# Patient Record
Sex: Female | Born: 1970 | Race: White | Hispanic: No | Marital: Married | Smoking: Never smoker
Health system: Southern US, Community
[De-identification: ages and names within clinical notes are randomized; demographics above are authoritative.]

## PROBLEM LIST (undated history)

## (undated) DIAGNOSIS — F329 Major depressive disorder, single episode, unspecified: Secondary | ICD-10-CM

## (undated) DIAGNOSIS — Z801 Family history of malignant neoplasm of trachea, bronchus and lung: Secondary | ICD-10-CM

## (undated) DIAGNOSIS — Z800 Family history of malignant neoplasm of digestive organs: Secondary | ICD-10-CM

## (undated) DIAGNOSIS — H269 Unspecified cataract: Secondary | ICD-10-CM

## (undated) DIAGNOSIS — F419 Anxiety disorder, unspecified: Secondary | ICD-10-CM

## (undated) DIAGNOSIS — F32A Depression, unspecified: Secondary | ICD-10-CM

## (undated) DIAGNOSIS — J302 Other seasonal allergic rhinitis: Secondary | ICD-10-CM

## (undated) DIAGNOSIS — I10 Essential (primary) hypertension: Secondary | ICD-10-CM

## (undated) DIAGNOSIS — E785 Hyperlipidemia, unspecified: Secondary | ICD-10-CM

## (undated) HISTORY — PX: DIAGNOSTIC LAPAROSCOPY: SUR761

## (undated) HISTORY — PX: EXPLORATORY LAPAROTOMY: SUR591

## (undated) HISTORY — DX: Family history of malignant neoplasm of trachea, bronchus and lung: Z80.1

## (undated) HISTORY — DX: Family history of malignant neoplasm of digestive organs: Z80.0

## (undated) HISTORY — DX: Unspecified cataract: H26.9

## (undated) HISTORY — DX: Hyperlipidemia, unspecified: E78.5

## (undated) HISTORY — PX: EYE SURGERY: SHX253

## (undated) HISTORY — PX: CRANIOTOMY: SHX93

## (undated) HISTORY — PX: ADENOIDECTOMY: SUR15

## (undated) HISTORY — PX: CATARACT EXTRACTION, BILATERAL: SHX1313

---

## 2012-01-12 ENCOUNTER — Other Ambulatory Visit: Payer: Self-pay | Admitting: Obstetrics & Gynecology

## 2012-01-12 DIAGNOSIS — R928 Other abnormal and inconclusive findings on diagnostic imaging of breast: Secondary | ICD-10-CM

## 2012-01-16 ENCOUNTER — Ambulatory Visit
Admission: RE | Admit: 2012-01-16 | Discharge: 2012-01-16 | Disposition: A | Discharge status: Home / Self Care | Payer: BC Managed Care – PPO | Source: Ambulatory Visit | Attending: Obstetrics & Gynecology | Admitting: Obstetrics & Gynecology

## 2012-01-16 DIAGNOSIS — R928 Other abnormal and inconclusive findings on diagnostic imaging of breast: Secondary | ICD-10-CM

## 2012-12-10 ENCOUNTER — Ambulatory Visit (INDEPENDENT_AMBULATORY_CARE_PROVIDER_SITE_OTHER): Payer: BC Managed Care – PPO | Admitting: Licensed Clinical Social Worker

## 2012-12-10 DIAGNOSIS — F331 Major depressive disorder, recurrent, moderate: Secondary | ICD-10-CM

## 2012-12-27 ENCOUNTER — Ambulatory Visit (INDEPENDENT_AMBULATORY_CARE_PROVIDER_SITE_OTHER): Payer: BC Managed Care – PPO | Admitting: Licensed Clinical Social Worker

## 2012-12-27 DIAGNOSIS — F331 Major depressive disorder, recurrent, moderate: Secondary | ICD-10-CM

## 2012-12-30 ENCOUNTER — Ambulatory Visit (INDEPENDENT_AMBULATORY_CARE_PROVIDER_SITE_OTHER): Payer: BC Managed Care – PPO | Admitting: Licensed Clinical Social Worker

## 2012-12-30 DIAGNOSIS — F331 Major depressive disorder, recurrent, moderate: Secondary | ICD-10-CM

## 2013-01-02 ENCOUNTER — Other Ambulatory Visit: Payer: Self-pay

## 2013-01-02 DIAGNOSIS — Z1231 Encounter for screening mammogram for malignant neoplasm of breast: Secondary | ICD-10-CM

## 2013-01-10 ENCOUNTER — Ambulatory Visit (INDEPENDENT_AMBULATORY_CARE_PROVIDER_SITE_OTHER): Payer: BC Managed Care – PPO | Admitting: Licensed Clinical Social Worker

## 2013-01-10 DIAGNOSIS — F331 Major depressive disorder, recurrent, moderate: Secondary | ICD-10-CM

## 2013-01-22 ENCOUNTER — Ambulatory Visit (INDEPENDENT_AMBULATORY_CARE_PROVIDER_SITE_OTHER): Payer: BC Managed Care – PPO | Admitting: Licensed Clinical Social Worker

## 2013-01-22 DIAGNOSIS — F331 Major depressive disorder, recurrent, moderate: Secondary | ICD-10-CM

## 2013-01-24 ENCOUNTER — Ambulatory Visit
Admission: RE | Admit: 2013-01-24 | Discharge: 2013-01-24 | Disposition: A | Discharge status: Home / Self Care | Payer: BC Managed Care – PPO | Source: Ambulatory Visit

## 2013-01-24 DIAGNOSIS — Z1231 Encounter for screening mammogram for malignant neoplasm of breast: Secondary | ICD-10-CM

## 2013-02-05 ENCOUNTER — Ambulatory Visit (INDEPENDENT_AMBULATORY_CARE_PROVIDER_SITE_OTHER): Payer: BC Managed Care – PPO | Admitting: Licensed Clinical Social Worker

## 2013-02-05 DIAGNOSIS — F331 Major depressive disorder, recurrent, moderate: Secondary | ICD-10-CM

## 2013-02-19 ENCOUNTER — Ambulatory Visit (INDEPENDENT_AMBULATORY_CARE_PROVIDER_SITE_OTHER): Payer: BC Managed Care – PPO | Admitting: Licensed Clinical Social Worker

## 2013-02-19 DIAGNOSIS — F331 Major depressive disorder, recurrent, moderate: Secondary | ICD-10-CM

## 2013-03-17 ENCOUNTER — Ambulatory Visit (INDEPENDENT_AMBULATORY_CARE_PROVIDER_SITE_OTHER): Payer: BC Managed Care – PPO | Admitting: Licensed Clinical Social Worker

## 2013-03-17 DIAGNOSIS — F331 Major depressive disorder, recurrent, moderate: Secondary | ICD-10-CM

## 2013-04-02 ENCOUNTER — Ambulatory Visit (INDEPENDENT_AMBULATORY_CARE_PROVIDER_SITE_OTHER): Payer: BC Managed Care – PPO | Admitting: Licensed Clinical Social Worker

## 2013-04-02 DIAGNOSIS — F331 Major depressive disorder, recurrent, moderate: Secondary | ICD-10-CM

## 2013-04-28 ENCOUNTER — Ambulatory Visit (INDEPENDENT_AMBULATORY_CARE_PROVIDER_SITE_OTHER): Payer: BC Managed Care – PPO | Admitting: Licensed Clinical Social Worker

## 2013-04-28 DIAGNOSIS — F331 Major depressive disorder, recurrent, moderate: Secondary | ICD-10-CM

## 2015-02-05 ENCOUNTER — Other Ambulatory Visit: Payer: Self-pay | Admitting: Obstetrics & Gynecology

## 2015-02-05 DIAGNOSIS — R928 Other abnormal and inconclusive findings on diagnostic imaging of breast: Secondary | ICD-10-CM

## 2015-02-12 ENCOUNTER — Other Ambulatory Visit: Payer: Self-pay

## 2015-02-15 ENCOUNTER — Ambulatory Visit
Admission: RE | Admit: 2015-02-15 | Discharge: 2015-02-15 | Disposition: A | Discharge status: Home / Self Care | Payer: BLUE CROSS/BLUE SHIELD | Source: Ambulatory Visit | Attending: Obstetrics & Gynecology | Admitting: Obstetrics & Gynecology

## 2015-02-15 ENCOUNTER — Other Ambulatory Visit: Payer: Self-pay | Admitting: Obstetrics & Gynecology

## 2015-02-15 DIAGNOSIS — R928 Other abnormal and inconclusive findings on diagnostic imaging of breast: Secondary | ICD-10-CM

## 2015-02-15 DIAGNOSIS — R921 Mammographic calcification found on diagnostic imaging of breast: Secondary | ICD-10-CM

## 2015-02-17 ENCOUNTER — Other Ambulatory Visit: Payer: Self-pay | Admitting: Obstetrics & Gynecology

## 2015-02-17 DIAGNOSIS — R921 Mammographic calcification found on diagnostic imaging of breast: Secondary | ICD-10-CM

## 2015-02-18 ENCOUNTER — Ambulatory Visit
Admission: RE | Admit: 2015-02-18 | Discharge: 2015-02-18 | Disposition: A | Discharge status: Home / Self Care | Payer: BLUE CROSS/BLUE SHIELD | Source: Ambulatory Visit | Attending: Obstetrics & Gynecology | Admitting: Obstetrics & Gynecology

## 2015-02-18 DIAGNOSIS — R921 Mammographic calcification found on diagnostic imaging of breast: Secondary | ICD-10-CM

## 2016-02-07 ENCOUNTER — Other Ambulatory Visit: Payer: Self-pay | Admitting: Obstetrics & Gynecology

## 2016-02-07 DIAGNOSIS — R928 Other abnormal and inconclusive findings on diagnostic imaging of breast: Secondary | ICD-10-CM

## 2016-02-11 ENCOUNTER — Ambulatory Visit
Admission: RE | Admit: 2016-02-11 | Discharge: 2016-02-11 | Disposition: A | Discharge status: Home / Self Care | Payer: No Typology Code available for payment source | Source: Ambulatory Visit | Attending: Obstetrics & Gynecology | Admitting: Obstetrics & Gynecology

## 2016-02-11 ENCOUNTER — Other Ambulatory Visit: Payer: Self-pay | Admitting: Obstetrics & Gynecology

## 2016-02-11 DIAGNOSIS — N6489 Other specified disorders of breast: Secondary | ICD-10-CM

## 2016-02-11 DIAGNOSIS — R928 Other abnormal and inconclusive findings on diagnostic imaging of breast: Secondary | ICD-10-CM

## 2016-02-17 ENCOUNTER — Other Ambulatory Visit: Payer: Self-pay | Admitting: Obstetrics & Gynecology

## 2016-02-17 DIAGNOSIS — N6489 Other specified disorders of breast: Secondary | ICD-10-CM

## 2016-02-18 ENCOUNTER — Ambulatory Visit
Admission: RE | Admit: 2016-02-18 | Discharge: 2016-02-18 | Disposition: A | Discharge status: Home / Self Care | Payer: No Typology Code available for payment source | Source: Ambulatory Visit | Attending: Obstetrics & Gynecology | Admitting: Obstetrics & Gynecology

## 2016-02-18 DIAGNOSIS — N6489 Other specified disorders of breast: Secondary | ICD-10-CM

## 2016-03-02 ENCOUNTER — Encounter (HOSPITAL_BASED_OUTPATIENT_CLINIC_OR_DEPARTMENT_OTHER): Payer: Self-pay | Admitting: *Deleted

## 2016-03-02 ENCOUNTER — Other Ambulatory Visit: Payer: Self-pay | Admitting: General Surgery

## 2016-03-02 ENCOUNTER — Encounter (HOSPITAL_BASED_OUTPATIENT_CLINIC_OR_DEPARTMENT_OTHER)
Admission: RE | Admit: 2016-03-02 | Discharge: 2016-03-02 | Disposition: A | Discharge status: Home / Self Care | Payer: No Typology Code available for payment source | Source: Ambulatory Visit | Attending: General Surgery | Admitting: General Surgery

## 2016-03-02 ENCOUNTER — Other Ambulatory Visit: Payer: Self-pay

## 2016-03-02 DIAGNOSIS — N6489 Other specified disorders of breast: Secondary | ICD-10-CM

## 2016-03-02 DIAGNOSIS — Z01810 Encounter for preprocedural cardiovascular examination: Secondary | ICD-10-CM | POA: Insufficient documentation

## 2016-03-02 DIAGNOSIS — I10 Essential (primary) hypertension: Secondary | ICD-10-CM | POA: Insufficient documentation

## 2016-03-02 NOTE — Progress Notes (Signed)
Pt in for PAT appt. EKG done and Boost Breeze given. Instructions reviewed for DOS.

## 2016-03-06 ENCOUNTER — Ambulatory Visit
Admission: RE | Admit: 2016-03-06 | Discharge: 2016-03-06 | Disposition: A | Discharge status: Home / Self Care | Payer: No Typology Code available for payment source | Source: Ambulatory Visit | Attending: General Surgery | Admitting: General Surgery

## 2016-03-06 DIAGNOSIS — N6489 Other specified disorders of breast: Secondary | ICD-10-CM

## 2016-03-08 ENCOUNTER — Ambulatory Visit (HOSPITAL_BASED_OUTPATIENT_CLINIC_OR_DEPARTMENT_OTHER): Payer: Self-pay | Admitting: Anesthesiology

## 2016-03-08 ENCOUNTER — Ambulatory Visit
Admission: RE | Admit: 2016-03-08 | Discharge: 2016-03-08 | Disposition: A | Discharge status: Home / Self Care | Payer: No Typology Code available for payment source | Source: Ambulatory Visit | Attending: General Surgery | Admitting: General Surgery

## 2016-03-08 ENCOUNTER — Encounter (HOSPITAL_BASED_OUTPATIENT_CLINIC_OR_DEPARTMENT_OTHER)
Admission: RE | Disposition: A | Discharge status: Home / Self Care | Payer: Self-pay | Source: Ambulatory Visit | Attending: General Surgery

## 2016-03-08 ENCOUNTER — Ambulatory Visit (HOSPITAL_BASED_OUTPATIENT_CLINIC_OR_DEPARTMENT_OTHER)
Admission: RE | Admit: 2016-03-08 | Discharge: 2016-03-08 | Disposition: A | Discharge status: Home / Self Care | Payer: Self-pay | Source: Ambulatory Visit | Attending: General Surgery | Admitting: General Surgery

## 2016-03-08 ENCOUNTER — Encounter (HOSPITAL_BASED_OUTPATIENT_CLINIC_OR_DEPARTMENT_OTHER): Payer: Self-pay | Admitting: Anesthesiology

## 2016-03-08 DIAGNOSIS — Z8249 Family history of ischemic heart disease and other diseases of the circulatory system: Secondary | ICD-10-CM | POA: Insufficient documentation

## 2016-03-08 DIAGNOSIS — F329 Major depressive disorder, single episode, unspecified: Secondary | ICD-10-CM | POA: Insufficient documentation

## 2016-03-08 DIAGNOSIS — N6489 Other specified disorders of breast: Secondary | ICD-10-CM

## 2016-03-08 DIAGNOSIS — I10 Essential (primary) hypertension: Secondary | ICD-10-CM | POA: Insufficient documentation

## 2016-03-08 DIAGNOSIS — F419 Anxiety disorder, unspecified: Secondary | ICD-10-CM | POA: Insufficient documentation

## 2016-03-08 DIAGNOSIS — Z833 Family history of diabetes mellitus: Secondary | ICD-10-CM | POA: Insufficient documentation

## 2016-03-08 DIAGNOSIS — Z9889 Other specified postprocedural states: Secondary | ICD-10-CM | POA: Insufficient documentation

## 2016-03-08 DIAGNOSIS — Z801 Family history of malignant neoplasm of trachea, bronchus and lung: Secondary | ICD-10-CM | POA: Insufficient documentation

## 2016-03-08 DIAGNOSIS — N6091 Unspecified benign mammary dysplasia of right breast: Secondary | ICD-10-CM | POA: Insufficient documentation

## 2016-03-08 DIAGNOSIS — Z79899 Other long term (current) drug therapy: Secondary | ICD-10-CM | POA: Insufficient documentation

## 2016-03-08 HISTORY — DX: Other seasonal allergic rhinitis: J30.2

## 2016-03-08 HISTORY — PX: RADIOACTIVE SEED GUIDED EXCISIONAL BREAST BIOPSY: SHX6490

## 2016-03-08 HISTORY — DX: Essential (primary) hypertension: I10

## 2016-03-08 HISTORY — DX: Major depressive disorder, single episode, unspecified: F32.9

## 2016-03-08 HISTORY — DX: Depression, unspecified: F32.A

## 2016-03-08 HISTORY — DX: Anxiety disorder, unspecified: F41.9

## 2016-03-08 SURGERY — RADIOACTIVE SEED GUIDED BREAST BIOPSY
Anesthesia: General | Site: Breast | Laterality: Right

## 2016-03-08 MED ORDER — SCOPOLAMINE 1 MG/3DAYS TD PT72
MEDICATED_PATCH | TRANSDERMAL | Status: AC
  Filled 2016-03-08: qty 1

## 2016-03-08 MED ORDER — DEXAMETHASONE SODIUM PHOSPHATE 4 MG/ML IJ SOLN
INTRAMUSCULAR | Status: DC | PRN
  Administered 2016-03-08: 10 mg via INTRAVENOUS

## 2016-03-08 MED ORDER — PROPOFOL 10 MG/ML IV BOLUS
INTRAVENOUS | Status: AC
  Filled 2016-03-08: qty 20

## 2016-03-08 MED ORDER — METHYLENE BLUE 0.5 % INJ SOLN
INTRAVENOUS | Status: AC
  Filled 2016-03-08: qty 10

## 2016-03-08 MED ORDER — LACTATED RINGERS IV SOLN
INTRAVENOUS | Status: DC
  Administered 2016-03-08 (×2): via INTRAVENOUS

## 2016-03-08 MED ORDER — ACETAMINOPHEN 500 MG PO TABS
1000.0000 mg | ORAL_TABLET | ORAL | Status: AC
  Administered 2016-03-08: 1000 mg via ORAL

## 2016-03-08 MED ORDER — MIDAZOLAM HCL 2 MG/2ML IJ SOLN
INTRAMUSCULAR | Status: AC
  Filled 2016-03-08: qty 2

## 2016-03-08 MED ORDER — HYDROMORPHONE HCL 1 MG/ML IJ SOLN
0.2500 mg | INTRAMUSCULAR | Status: DC | PRN
  Administered 2016-03-08 (×3): 0.5 mg via INTRAVENOUS

## 2016-03-08 MED ORDER — PROPOFOL 10 MG/ML IV BOLUS
INTRAVENOUS | Status: DC | PRN
  Administered 2016-03-08: 150 mg via INTRAVENOUS
  Administered 2016-03-08: 50 mg via INTRAVENOUS

## 2016-03-08 MED ORDER — FENTANYL CITRATE (PF) 100 MCG/2ML IJ SOLN
INTRAMUSCULAR | Status: AC
  Filled 2016-03-08: qty 2

## 2016-03-08 MED ORDER — ONDANSETRON HCL 4 MG/2ML IJ SOLN
INTRAMUSCULAR | Status: DC | PRN
  Administered 2016-03-08: 4 mg via INTRAVENOUS

## 2016-03-08 MED ORDER — GABAPENTIN 300 MG PO CAPS
300.0000 mg | ORAL_CAPSULE | ORAL | Status: AC
  Administered 2016-03-08: 300 mg via ORAL

## 2016-03-08 MED ORDER — MIDAZOLAM HCL 5 MG/5ML IJ SOLN
INTRAMUSCULAR | Status: DC | PRN
  Administered 2016-03-08: 2 mg via INTRAVENOUS

## 2016-03-08 MED ORDER — FENTANYL CITRATE (PF) 100 MCG/2ML IJ SOLN: 50.0000 ug | INTRAMUSCULAR | Status: DC | PRN

## 2016-03-08 MED ORDER — OXYCODONE-ACETAMINOPHEN 10-325 MG PO TABS: 1.0000 | ORAL_TABLET | Freq: Four times a day (QID) | ORAL | 0 refills | Status: AC | PRN

## 2016-03-08 MED ORDER — HEPARIN (PORCINE) IN NACL 2-0.9 UNIT/ML-% IJ SOLN
INTRAMUSCULAR | Status: AC
  Filled 2016-03-08: qty 1000

## 2016-03-08 MED ORDER — CEFAZOLIN SODIUM-DEXTROSE 2-4 GM/100ML-% IV SOLN
2.0000 g | INTRAVENOUS | Status: AC
  Administered 2016-03-08: 2 g via INTRAVENOUS

## 2016-03-08 MED ORDER — FENTANYL CITRATE (PF) 100 MCG/2ML IJ SOLN
INTRAMUSCULAR | Status: DC | PRN
  Administered 2016-03-08: 100 ug via INTRAVENOUS

## 2016-03-08 MED ORDER — MIDAZOLAM HCL 2 MG/2ML IJ SOLN: 1.0000 mg | INTRAMUSCULAR | Status: DC | PRN

## 2016-03-08 MED ORDER — DEXAMETHASONE SODIUM PHOSPHATE 10 MG/ML IJ SOLN
INTRAMUSCULAR | Status: AC
  Filled 2016-03-08: qty 1

## 2016-03-08 MED ORDER — GLYCOPYRROLATE 0.2 MG/ML IJ SOLN: 0.2000 mg | Freq: Once | INTRAMUSCULAR | Status: DC | PRN

## 2016-03-08 MED ORDER — ONDANSETRON HCL 4 MG/2ML IJ SOLN
INTRAMUSCULAR | Status: AC
  Filled 2016-03-08: qty 2

## 2016-03-08 MED ORDER — CEFAZOLIN SODIUM-DEXTROSE 2-4 GM/100ML-% IV SOLN
INTRAVENOUS | Status: AC
  Filled 2016-03-08: qty 100

## 2016-03-08 MED ORDER — SODIUM CHLORIDE 0.9 % IJ SOLN
INTRAMUSCULAR | Status: AC
  Filled 2016-03-08: qty 10

## 2016-03-08 MED ORDER — HEPARIN SOD (PORK) LOCK FLUSH 100 UNIT/ML IV SOLN
INTRAVENOUS | Status: AC
  Filled 2016-03-08: qty 10

## 2016-03-08 MED ORDER — HYDROMORPHONE HCL 1 MG/ML IJ SOLN
INTRAMUSCULAR | Status: AC
Start: 2016-03-08 — End: 2016-03-08
  Filled 2016-03-08: qty 1

## 2016-03-08 MED ORDER — GABAPENTIN 300 MG PO CAPS
ORAL_CAPSULE | ORAL | Status: AC
  Filled 2016-03-08: qty 1

## 2016-03-08 MED ORDER — PROMETHAZINE HCL 25 MG/ML IJ SOLN: 6.2500 mg | INTRAMUSCULAR | Status: DC | PRN

## 2016-03-08 MED ORDER — SCOPOLAMINE 1 MG/3DAYS TD PT72
1.0000 | MEDICATED_PATCH | Freq: Once | TRANSDERMAL | Status: DC | PRN
  Administered 2016-03-08: 1.5 mg via TRANSDERMAL

## 2016-03-08 MED ORDER — BUPIVACAINE HCL (PF) 0.25 % IJ SOLN
INTRAMUSCULAR | Status: DC | PRN
  Administered 2016-03-08: 10 mL

## 2016-03-08 MED ORDER — LIDOCAINE HCL (CARDIAC) 20 MG/ML IV SOLN
INTRAVENOUS | Status: DC | PRN
  Administered 2016-03-08: 30 mg via INTRAVENOUS

## 2016-03-08 MED ORDER — HYDROMORPHONE HCL 1 MG/ML IJ SOLN
INTRAMUSCULAR | Status: AC
  Filled 2016-03-08: qty 1

## 2016-03-08 MED ORDER — LIDOCAINE 2% (20 MG/ML) 5 ML SYRINGE
INTRAMUSCULAR | Status: AC
  Filled 2016-03-08: qty 5

## 2016-03-08 MED ORDER — ACETAMINOPHEN 500 MG PO TABS
ORAL_TABLET | ORAL | Status: AC
  Filled 2016-03-08: qty 2

## 2016-03-08 MED ORDER — BUPIVACAINE HCL (PF) 0.25 % IJ SOLN
INTRAMUSCULAR | Status: AC
  Filled 2016-03-08: qty 150

## 2016-03-08 SURGICAL SUPPLY — 61 items
ADH SKN CLS APL DERMABOND .7 (GAUZE/BANDAGES/DRESSINGS) ×1
BINDER BREAST LRG (GAUZE/BANDAGES/DRESSINGS) IMPLANT
BINDER BREAST MEDIUM (GAUZE/BANDAGES/DRESSINGS) ×2 IMPLANT
BINDER BREAST XLRG (GAUZE/BANDAGES/DRESSINGS) IMPLANT
BINDER BREAST XXLRG (GAUZE/BANDAGES/DRESSINGS) IMPLANT
BLADE SURG 15 STRL LF DISP TIS (BLADE) ×1 IMPLANT
BLADE SURG 15 STRL SS (BLADE) ×3
CANISTER SUC SOCK COL 7IN (MISCELLANEOUS) IMPLANT
CANISTER SUCT 1200ML W/VALVE (MISCELLANEOUS) IMPLANT
CHLORAPREP W/TINT 26ML (MISCELLANEOUS) ×3 IMPLANT
CLIP TI WIDE RED SMALL 6 (CLIP) IMPLANT
CLOSURE WOUND 1/2 X4 (GAUZE/BANDAGES/DRESSINGS) ×1
COVER BACK TABLE 60X90IN (DRAPES) ×3 IMPLANT
COVER MAYO STAND STRL (DRAPES) ×3 IMPLANT
COVER PROBE W GEL 5X96 (DRAPES) ×3 IMPLANT
DECANTER SPIKE VIAL GLASS SM (MISCELLANEOUS) IMPLANT
DERMABOND ADVANCED (GAUZE/BANDAGES/DRESSINGS) ×2
DERMABOND ADVANCED .7 DNX12 (GAUZE/BANDAGES/DRESSINGS) ×1 IMPLANT
DEVICE DUBIN W/COMP PLATE 8390 (MISCELLANEOUS) ×3 IMPLANT
DRAPE LAPAROSCOPIC ABDOMINAL (DRAPES) ×3 IMPLANT
DRAPE UTILITY XL STRL (DRAPES) ×3 IMPLANT
DRSG TEGADERM 4X4.75 (GAUZE/BANDAGES/DRESSINGS) IMPLANT
ELECT COATED BLADE 2.86 ST (ELECTRODE) ×3 IMPLANT
ELECT REM PT RETURN 9FT ADLT (ELECTROSURGICAL) ×3
ELECTRODE REM PT RTRN 9FT ADLT (ELECTROSURGICAL) ×1 IMPLANT
GLOVE BIO SURGEON STRL SZ7 (GLOVE) ×6 IMPLANT
GLOVE BIOGEL PI IND STRL 6.5 (GLOVE) IMPLANT
GLOVE BIOGEL PI IND STRL 7.0 (GLOVE) IMPLANT
GLOVE BIOGEL PI IND STRL 7.5 (GLOVE) ×1 IMPLANT
GLOVE BIOGEL PI INDICATOR 6.5 (GLOVE) ×2
GLOVE BIOGEL PI INDICATOR 7.0 (GLOVE) ×4
GLOVE BIOGEL PI INDICATOR 7.5 (GLOVE) ×2
GOWN STRL REUS W/ TWL LRG LVL3 (GOWN DISPOSABLE) ×2 IMPLANT
GOWN STRL REUS W/TWL LRG LVL3 (GOWN DISPOSABLE) ×6
ILLUMINATOR WAVEGUIDE N/F (MISCELLANEOUS) IMPLANT
KIT MARKER MARGIN INK (KITS) ×3 IMPLANT
LIGHT WAVEGUIDE WIDE FLAT (MISCELLANEOUS) IMPLANT
NDL HYPO 25X1 1.5 SAFETY (NEEDLE) ×1 IMPLANT
NEEDLE HYPO 25X1 1.5 SAFETY (NEEDLE) ×3 IMPLANT
NS IRRIG 1000ML POUR BTL (IV SOLUTION) IMPLANT
PACK BASIN DAY SURGERY FS (CUSTOM PROCEDURE TRAY) ×3 IMPLANT
PENCIL BUTTON HOLSTER BLD 10FT (ELECTRODE) ×3 IMPLANT
SHEET MEDIUM DRAPE 40X70 STRL (DRAPES) IMPLANT
SLEEVE SCD COMPRESS KNEE MED (MISCELLANEOUS) ×3 IMPLANT
SPONGE GAUZE 4X4 12PLY STER LF (GAUZE/BANDAGES/DRESSINGS) IMPLANT
SPONGE LAP 4X18 X RAY DECT (DISPOSABLE) ×3 IMPLANT
STRIP CLOSURE SKIN 1/2X4 (GAUZE/BANDAGES/DRESSINGS) ×2 IMPLANT
SUT MNCRL AB 4-0 PS2 18 (SUTURE) IMPLANT
SUT MON AB 5-0 PS2 18 (SUTURE) ×2 IMPLANT
SUT SILK 2 0 SH (SUTURE) IMPLANT
SUT VIC AB 2-0 SH 27 (SUTURE) ×3
SUT VIC AB 2-0 SH 27XBRD (SUTURE) ×1 IMPLANT
SUT VIC AB 3-0 SH 27 (SUTURE) ×3
SUT VIC AB 3-0 SH 27X BRD (SUTURE) ×1 IMPLANT
SUT VIC AB 5-0 PS2 18 (SUTURE) IMPLANT
SYR CONTROL 10ML LL (SYRINGE) ×3 IMPLANT
TOWEL OR 17X24 6PK STRL BLUE (TOWEL DISPOSABLE) ×3 IMPLANT
TOWEL OR NON WOVEN STRL DISP B (DISPOSABLE) ×3 IMPLANT
TUBE CONNECTING 20'X1/4 (TUBING)
TUBE CONNECTING 20X1/4 (TUBING) IMPLANT
YANKAUER SUCT BULB TIP NO VENT (SUCTIONS) IMPLANT

## 2016-03-08 NOTE — H&P (Signed)
45 yof referred by Dr Linda Hedges for right breast mass on 3D mammogram. she has prior history of left breast calcs that were biopsied negative. her density is D. there is area of architectural distortion in the central and inferior right breast measuring 2.6 cm. US shows no abnl tissue and axilla negative. she has family history of lung cancer (nonsmoker) in her mother who is deceased age 47. she underwent core biopsy and this is a CSL. she has no mass or discharge on exam.   Other Problems Marjean Donna, [place]; 03/02/2016 9:58 AM) Anxiety Disorder Depression High blood pressure  Past Surgical History Marjean Donna, [place]; 03/02/2016 9:58 AM) Breast Biopsy Bilateral. Oral Surgery  Diagnostic Studies History Marjean Donna, CMA; 03/02/2016 9:58 AM) Colonoscopy never Mammogram within last year Pap Smear 1-5 years ago  Allergies Davy Pique Bynum, CMA; 03/02/2016 9:59 AM) No Known Drug Allergies10/26/2017  Medication History (Sonya Bynum, CMA; 03/02/2016 9:59 AM) Zoloft (25MG  Tablet, Oral) Active. Calcium "900" w/D (Oral) Active. Fish Oil + D3 (1000-1000MG -UNIT Capsule, Oral) Active. Valsartan (80MG  Tablet, Oral) Active. Medications Reconciled  Social History ([place]; 03/02/2016 9:58 AM) Alcohol use Occasional alcohol use. Caffeine use Carbonated beverages, Tea. No drug use Tobacco use Never smoker.  Family History Marjean Donna, [place]; 03/02/2016 9:58 AM) Cancer Mother. Diabetes Mellitus Father. Hypertension Father.  Pregnancy / Birth History Marjean Donna, [place]; 03/02/2016 9:58 AM) Age at menarche 35 years. Contraceptive History Oral contraceptives. Gravida 1 Maternal age 47-35 Para 1 Regular periods  Review of Systems ([place]; 03/02/2016 9:58 AM) General Present- Fatigue. Not Present- Appetite Loss, Chills, Fever, Night Sweats, Weight Gain and Weight Loss. Skin Not Present- Change in Wart/Mole, Dryness, Hives, Jaundice, New  Lesions, Non-Healing Wounds, Rash and Ulcer. HEENT Present- Wears glasses/contact lenses. Not Present- Earache, Hearing Loss, Hoarseness, Nose Bleed, Oral Ulcers, Ringing in the Ears, Seasonal Allergies, Sinus Pain, Sore Throat, Visual Disturbances and Yellow Eyes. Respiratory Not Present- Bloody sputum, Chronic Cough, Difficulty Breathing, Snoring and Wheezing. Breast Present- Breast Mass. Not Present- Breast Pain, Nipple Discharge and Skin Changes. Cardiovascular Not Present- Chest Pain, Difficulty Breathing Lying Down, Leg Cramps, Palpitations, Rapid Heart Rate, Shortness of Breath and Swelling of Extremities. Gastrointestinal Not Present- Abdominal Pain, Bloating, Bloody Stool, Change in Bowel Habits, Chronic diarrhea, Constipation, Difficulty Swallowing, Excessive gas, Gets full quickly at meals, Hemorrhoids, Indigestion, Nausea, Rectal Pain and Vomiting. Female Genitourinary Not Present- Frequency, Nocturia, Painful Urination, Pelvic Pain and Urgency. Musculoskeletal Not Present- Back Pain, Joint Pain, Joint Stiffness, Muscle Pain, Muscle Weakness and Swelling of Extremities. Neurological Not Present- Decreased Memory, Fainting, Headaches, Numbness, Seizures, Tingling, Tremor, Trouble walking and Weakness. Psychiatric Present- Anxiety, Depression, Fearful and Frequent crying. Not Present- Bipolar and Change in Sleep Pattern. Endocrine Not Present- Cold Intolerance, Excessive Hunger, Hair Changes, Heat Intolerance, Hot flashes and New Diabetes. Hematology Not Present- Blood Thinners, Easy Bruising, Excessive bleeding, Gland problems, HIV and Persistent Infections.  Vitals (Sonya Bynum CMA; 03/02/2016 9:59 AM) 03/02/2016 9:58 AM Weight: 133 lb Height: 64in Body Surface Area: 1.64 m Body Mass Index: 22.83 kg/m  Temp.: 21F(Temporal)  Pulse: 76 (Regular)  BP: 126/80 (Sitting, Left Arm, Standard)   Physical Exam Rolm Bookbinder MD; 03/02/2016 10:21 AM) General Mental  Status-Alert. Orientation-Oriented X3.  Chest and Lung Exam Chest and lung exam reveals -on auscultation, normal breath sounds, no adventitious sounds and normal vocal resonance.  Breast Nipples-No Discharge. Breast Lump-No Palpable Breast Mass.  Cardiovascular Cardiovascular examination reveals -normal heart sounds, regular rate and rhythm with no murmurs.  Lymphatic Head & Neck  General Head & Neck Lymphatics: Bilateral - Description - Normal. Axillary  General Axillary Region: Bilateral - Description - Normal. Note: no [place] adenopathy   Assessment & Plan Rolm Bookbinder MD; 03/02/2016 10:24 AM) RADIAL SCAR OF BREAST [id number]) Story: Right breast seed guided excisional biopsy discussed indication for excision being possibility of upgrading this lesion. we discussed seed guided excision, recovery, and risks.

## 2016-03-08 NOTE — Anesthesia Postprocedure Evaluation (Signed)
Anesthesia Post Note  Patient: Sierra Newman  Procedure(s) Performed: Procedure(s) (LRB): RADIOACTIVE SEED GUIDED EXCISIONAL BREAST BIOPSY (Right)  Patient location during evaluation: PACU Anesthesia Type: General Level of consciousness: awake and alert Pain management: pain level controlled Vital Signs Assessment: post-procedure vital signs reviewed and stable Respiratory status: spontaneous breathing, nonlabored ventilation, respiratory function stable and patient connected to nasal cannula oxygen Cardiovascular status: blood pressure returned to baseline and stable Postop Assessment: no signs of nausea or vomiting Anesthetic complications: no    Last Vitals:  Vitals:   03/08/16 1200 03/08/16 1253  BP: 108/61 117/60  Pulse: 63 68  Resp: (!) 9 16  Temp:  36.6 C    Last Pain:  Vitals:   03/08/16 1253  TempSrc: Oral  PainSc: 2                  Melisssa Donner,JAMES TERRILL

## 2016-03-08 NOTE — Anesthesia Procedure Notes (Signed)
Procedure Name: LMA Insertion Date/Time: 03/08/2016 10:13 AM Performed by: Toula Moos L Pre-anesthesia Checklist: Patient identified, Emergency Drugs available, Suction available, Patient being monitored and Timeout performed Patient Re-evaluated:Patient Re-evaluated prior to inductionOxygen Delivery Method: Circle system utilized Preoxygenation: Pre-oxygenation with 100% oxygen Intubation Type: IV induction Ventilation: Mask ventilation without difficulty LMA: LMA inserted LMA Size: 4.0 Number of attempts: 1 Airway Equipment and Method: Bite block Placement Confirmation: positive ETCO2 Tube secured with: Tape Dental Injury: Teeth and Oropharynx as per pre-operative assessment

## 2016-03-08 NOTE — Interval H&P Note (Signed)
History and Physical Interval Note:  03/08/2016 9:56 AM  Sierra Newman  has presented today for surgery, with the diagnosis of RIGHT BREAST MASS  The various methods of treatment have been discussed with the patient and family. After consideration of risks, benefits and other options for treatment, the patient has consented to  Procedure(s): RADIOACTIVE SEED GUIDED EXCISIONAL BREAST BIOPSY (Right) as a surgical intervention .  The patient's history has been reviewed, patient examined, no change in status, stable for surgery.  I have reviewed the patient's chart and labs.  Questions were answered to the patient's satisfaction.     [name]

## 2016-03-08 NOTE — Discharge Instructions (Signed)
[facility] °Office Phone Number [phone] ° °POST OP INSTRUCTIONS ° °Always review your discharge instruction sheet given to you by the facility where your surgery was performed. ° °IF YOU HAVE DISABILITY OR FAMILY LEAVE FORMS, YOU MUST BRING THEM TO THE OFFICE FOR PROCESSING.  DO NOT GIVE THEM TO YOUR DOCTOR. ° °1. A prescription for pain medication may be given to you upon discharge.  Take your pain medication as prescribed, if needed.  If narcotic pain medicine is not needed, then you may take acetaminophen (Tylenol), naprosyn (Alleve) or ibuprofen (Advil) as needed. °2. Take your usually prescribed medications unless otherwise directed °3. If you need a refill on your pain medication, please contact your pharmacy.  They will contact our office to request authorization.  Prescriptions will not be filled after 5pm or on week-ends. °4. You should eat very light the first 24 hours after surgery, such as soup, crackers, pudding, etc.  Resume your normal diet the day after surgery. °5. Most patients will experience some swelling and bruising in the breast.  Ice packs and a good support bra will help.  Wear the breast binder provided or a sports bra for 72 hours day and night.  After that wear a sports bra during the day until you return to the office. Swelling and bruising can take several days to resolve.  °6. It is common to experience some constipation if taking pain medication after surgery.  Increasing fluid intake and taking a stool softener will usually help or prevent this problem from occurring.  A mild laxative (Milk of Magnesia or Miralax) should be taken according to package directions if there are no bowel movements after 48 hours. °7. Unless discharge instructions indicate otherwise, you may remove your bandages 48 hours after surgery and you may shower at that time.  You may have steri-strips (small skin tapes) in place directly over the incision.  These strips should be left on the  skin for 7-10 days and will come off on their own.  If your surgeon used skin glue on the incision, you may shower in 24 hours.  The glue will flake off over the next 2-3 weeks.  Any sutures or staples will be removed at the office during your follow-up visit. °8. ACTIVITIES:  You may resume regular daily activities (gradually increasing) beginning the next day.  Wearing a good support bra or sports bra minimizes pain and swelling.  You may have sexual intercourse when it is comfortable. °a. You may drive when you no longer are taking prescription pain medication, you can comfortably wear a seatbelt, and you can safely maneuver your car and apply brakes. °b. RETURN TO WORK:  ______________________________________________________________________________________ °9. You should see your doctor in the office for a follow-up appointment approximately two weeks after your surgery.  Your doctor’s nurse will typically make your follow-up appointment when she calls you with your pathology report.  Expect your pathology report 3-4 business days after your surgery.  You may call to check if you do not hear from us after three days. °10. OTHER INSTRUCTIONS: _______________________________________________________________________________________________ _____________________________________________________________________________________________________________________________________ °_____________________________________________________________________________________________________________________________________ °_____________________________________________________________________________________________________________________________________ ° °WHEN TO CALL DR [place]FIELD: °1. Fever over 101.0 °2. Nausea and/or vomiting. °3. Extreme swelling or bruising. °4. Continued bleeding from incision. °5. Increased pain, redness, or drainage from the incision. ° °The clinic staff is available to answer your questions during regular  business hours.  Please don’t hesitate to call and ask to speak to one of the nurses for clinical concerns.  If   you have a medical emergency, go to the nearest emergency room or call 911.  A surgeon from [facility] is always on call at the hospital. ° °For further questions, please visit [email or web address] ° ° ° °Post Anesthesia Home Care Instructions ° °Activity: °Get plenty of rest for the remainder of the day. A responsible adult should stay with you for 24 hours following the procedure.  °For the next 24 hours, DO NOT: °-Drive a car °-Operate machinery °-Drink alcoholic beverages °-Take any medication unless instructed by your physician °-Make any legal decisions or sign important papers. ° °Meals: °Start with liquid foods such as gelatin or soup. Progress to regular foods as tolerated. Avoid greasy, spicy, heavy foods. If nausea and/or vomiting occur, drink only clear liquids until the nausea and/or vomiting subsides. Call your physician if vomiting continues. ° °Special Instructions/Symptoms: °Your throat may feel dry or sore from the anesthesia or the breathing tube placed in your throat during surgery. If this causes discomfort, gargle with warm salt water. The discomfort should disappear within 24 hours. ° °If you had a scopolamine patch placed behind your ear for the management of post- operative nausea and/or vomiting: ° °1. The medication in the patch is effective for 72 hours, after which it should be removed.  Wrap patch in a tissue and discard in the trash. Wash hands thoroughly with soap and water. °2. You may remove the patch earlier than 72 hours if you experience unpleasant side effects which may include dry mouth, dizziness or visual disturbances. °3. Avoid touching the patch. Wash your hands with soap and water after contact with the patch. °  ° °

## 2016-03-08 NOTE — Transfer of Care (Signed)
Immediate Anesthesia Transfer of Care Note  Patient: Sierra Newman  Procedure(s) Performed: Procedure(s): RADIOACTIVE SEED GUIDED EXCISIONAL BREAST BIOPSY (Right)  Patient Location: PACU  Anesthesia Type:General  Level of Consciousness: sedated  Airway & Oxygen Therapy: Patient Spontanous Breathing and Patient connected to face mask oxygen  Post-op Assessment: Report given to RN and Post -op Vital signs reviewed and stable  Post vital signs: Reviewed and stable  Last Vitals:  Vitals:   03/08/16 0849  BP: 123/70  Pulse: 79  Resp: 18  Temp: 36.7 C    Last Pain:  Vitals:   03/08/16 0849  TempSrc: Oral         Complications: No apparent anesthesia complications

## 2016-03-08 NOTE — Anesthesia Preprocedure Evaluation (Addendum)
Anesthesia Evaluation  Patient identified by MRN, date of birth, ID band Patient awake    Reviewed: Allergy & Precautions, NPO status , Patient's Chart, lab work & pertinent test results  Airway Mallampati: I       Dental  (+) Teeth Intact, Dental Advisory Given   Pulmonary neg pulmonary ROS,    breath sounds clear to auscultation       Cardiovascular hypertension, negative cardio ROS   Rhythm:Regular Rate:Normal     Neuro/Psych negative neurological ROS  negative psych ROS   GI/Hepatic negative GI ROS, Neg liver ROS,   Endo/Other  negative endocrine ROS  Renal/GU negative Renal ROS  negative genitourinary   Musculoskeletal negative musculoskeletal ROS (+)   Abdominal   Peds negative pediatric ROS (+)  Hematology negative hematology ROS (+)   Anesthesia Other Findings   Reproductive/Obstetrics negative OB ROS                             Anesthesia Physical Anesthesia Plan  ASA: I  Anesthesia Plan: General   Post-op Pain Management:    Induction: Intravenous  Airway Management Planned: LMA  Additional Equipment:   Intra-op Plan:   Post-operative Plan: Extubation in OR  Informed Consent: I have reviewed the patients History and Physical, chart, labs and discussed the procedure including the risks, benefits and alternatives for the proposed anesthesia with the patient or authorized representative who has indicated his/her understanding and acceptance.   Dental advisory given  Plan Discussed with: CRNA  Anesthesia Plan Comments:        Anesthesia Quick Evaluation

## 2016-03-08 NOTE — Op Note (Signed)
Preoperative diagnoses: Right breast mammographic lesion Postoperative diagnosis: Same as above Procedure:Rightbreast seed guided excisional biopsy Surgeon: Dr. Serita Grammes Anesthesia: Gen. Estimated blood loss: Minimal Complications: None Drains: None Specimens:Right breast tissue with paint Sponge and needle count correct at completion Disposition to recovery stable  Indications: This is a 59 yof who has right breast mass that underwent core and is radial scar. We discussed options and have elected to do seed guided excision.   Procedure: After informed consent was obtained she was then taken to the operating room. She was given cefazolin. Sequential compression devices were on her legs. She was placed under general anesthesia without complication. Her rightbreast was then prepped and draped in the standard sterile surgical fashion. A surgical timeout was then performed.  I located the radioactive seed with the neoprobe. I infiltrated marcaine in the area of the seed. I made a periareolar incision.I then used the neoprobe to guide the excision of the seed and surrounding tissue. This was confirmed by the neoprobe.  This was then taken for mammogram which confirmed removal of the seed and the clip. This was confirmed by radiology. This was then sent to pathology. Hemostasis was observed.I closed the breast tissue with a 2-0 Vicryl. The dermis was closed with 3-0 Vicryl and the skin with 5-0 Monocryl.Dermabond and steristrips were placed on the incision. She was transferred to recovery stable

## 2016-03-09 ENCOUNTER — Encounter (HOSPITAL_BASED_OUTPATIENT_CLINIC_OR_DEPARTMENT_OTHER): Payer: Self-pay | Admitting: General Surgery

## 2016-06-30 ENCOUNTER — Other Ambulatory Visit: Payer: Self-pay | Admitting: Obstetrics and Gynecology

## 2016-06-30 DIAGNOSIS — N63 Unspecified lump in unspecified breast: Secondary | ICD-10-CM

## 2016-07-04 ENCOUNTER — Ambulatory Visit
Admission: RE | Admit: 2016-07-04 | Discharge: 2016-07-04 | Disposition: A | Discharge status: Home / Self Care | Payer: No Typology Code available for payment source | Source: Ambulatory Visit | Attending: Obstetrics and Gynecology | Admitting: Obstetrics and Gynecology

## 2016-07-04 DIAGNOSIS — N63 Unspecified lump in unspecified breast: Secondary | ICD-10-CM

## 2018-05-15 IMAGING — MG 2D DIGITAL DIAGNOSTIC UNILATERAL RIGHT MAMMOGRAM WITH CAD AND AD
6 series · 6 of 14 positions shown · non-contrast
Comparison: February 02, 2016 and earlier priors

CLINICAL DATA: Possible mass right breast identified on recent 3D
screening mammogram.

EXAM:
2D DIGITAL DIAGNOSTIC RIGHT MAMMOGRAM WITH CAD AND ADJUNCT TOMO
ULTRASOUND RIGHT BREAST

[R MLO]
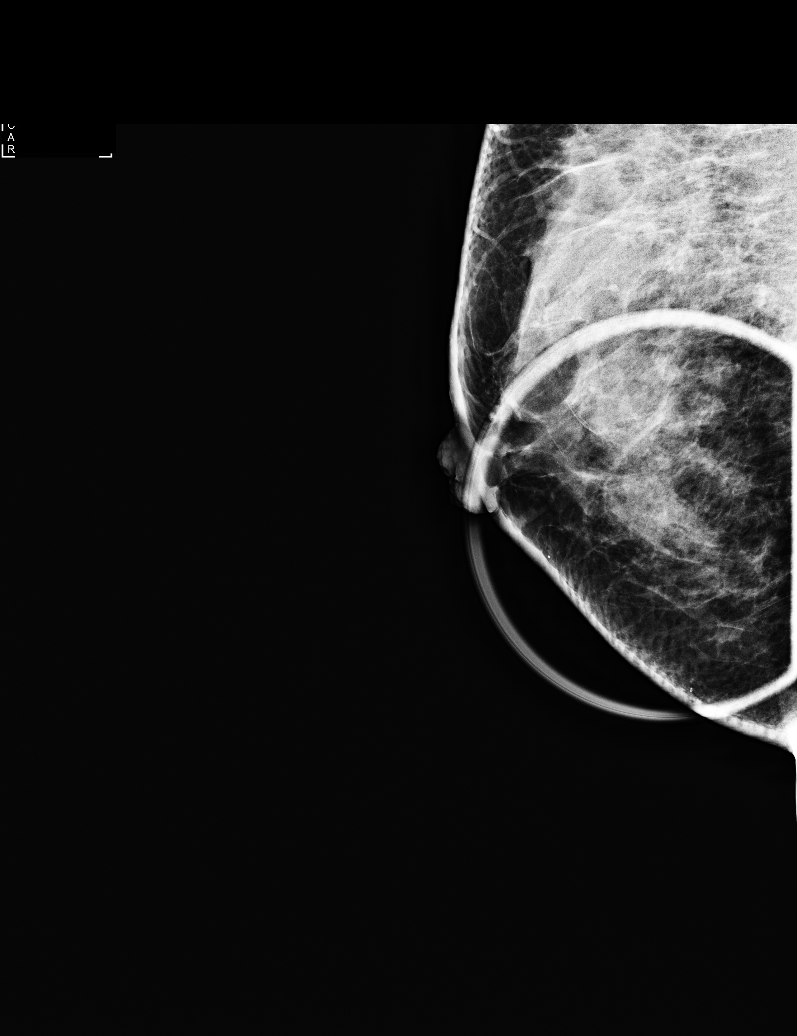

[R CC]
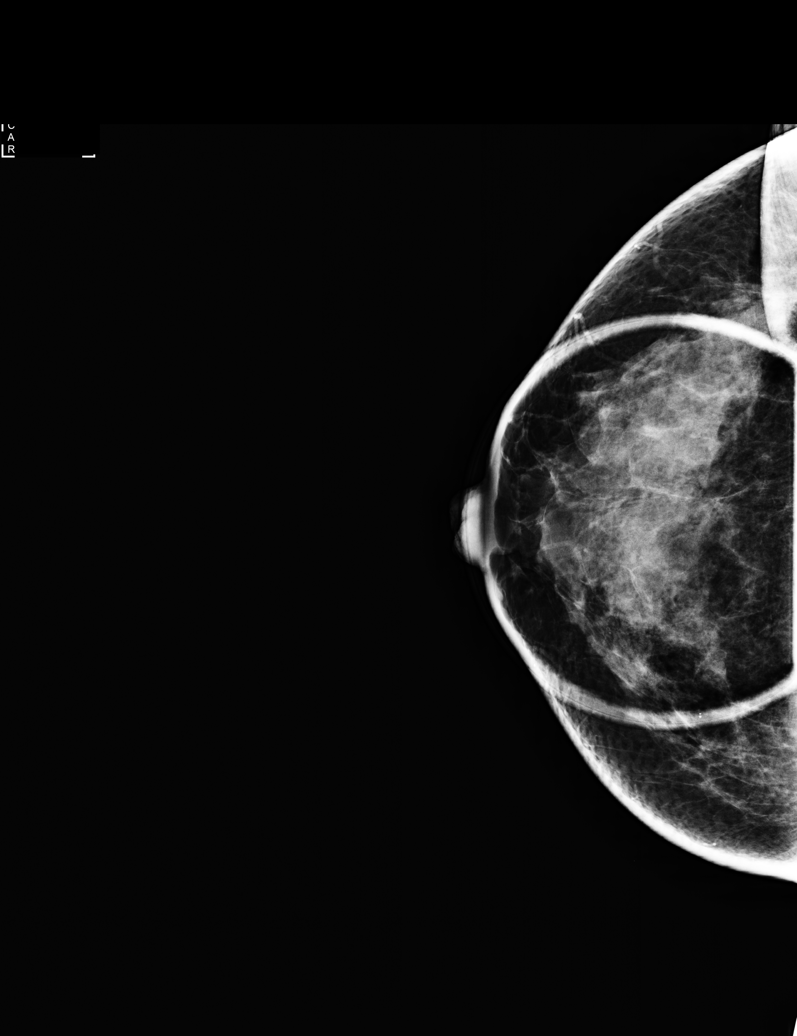

[R MLO synth-2D]
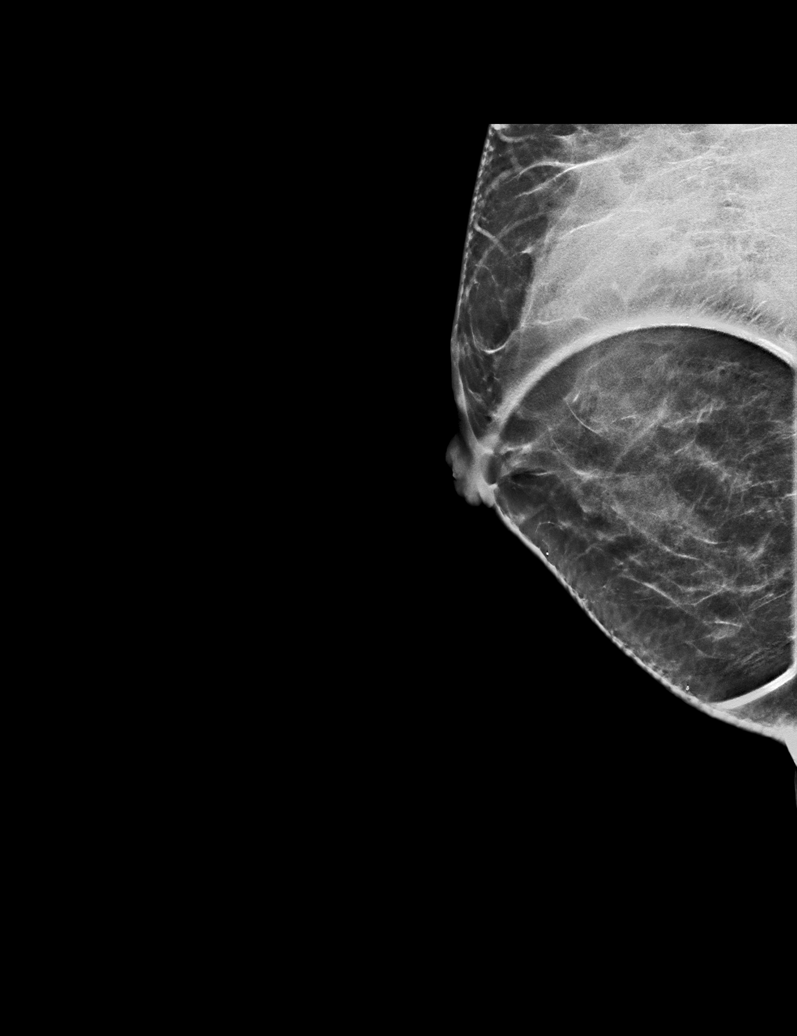

[R CC synth-2D]
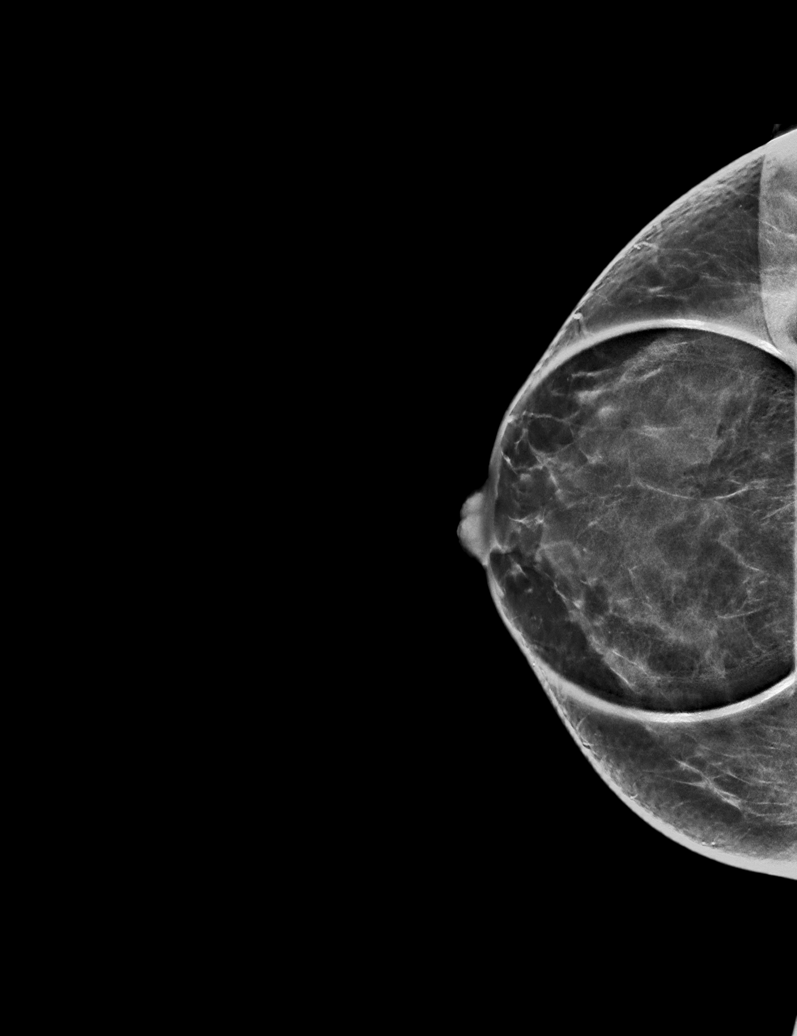

[R CC tomo · tomo slice 32/63.0]
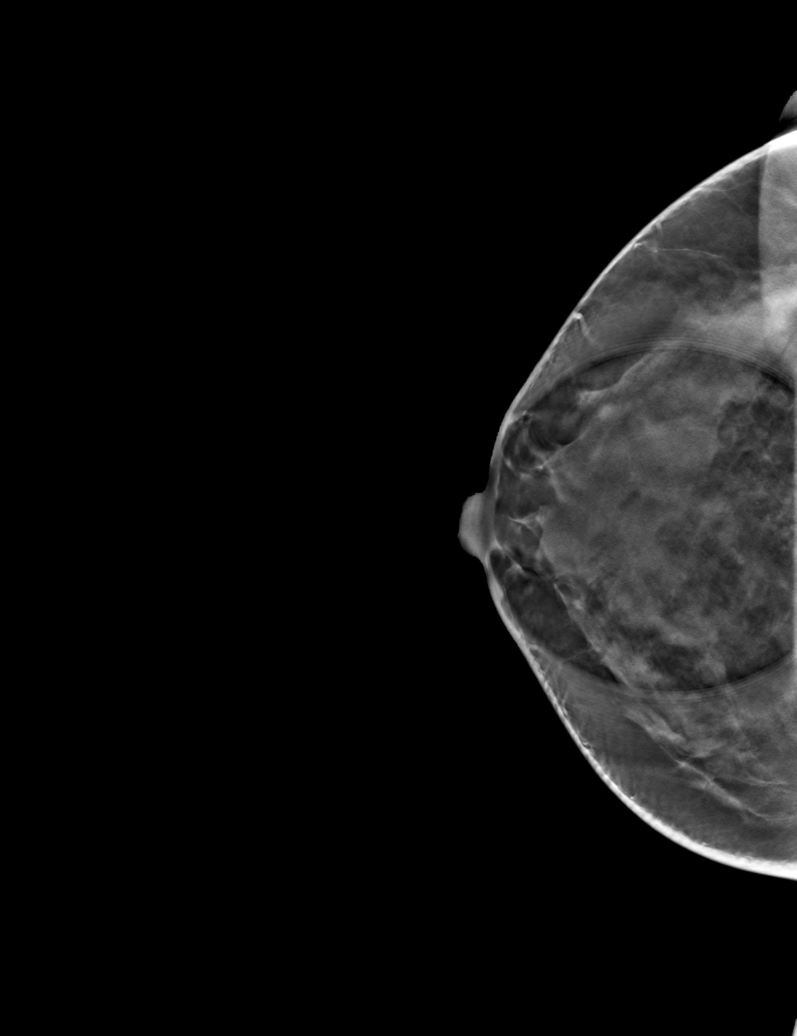

[R MLO tomo · tomo slice 29/58.0]
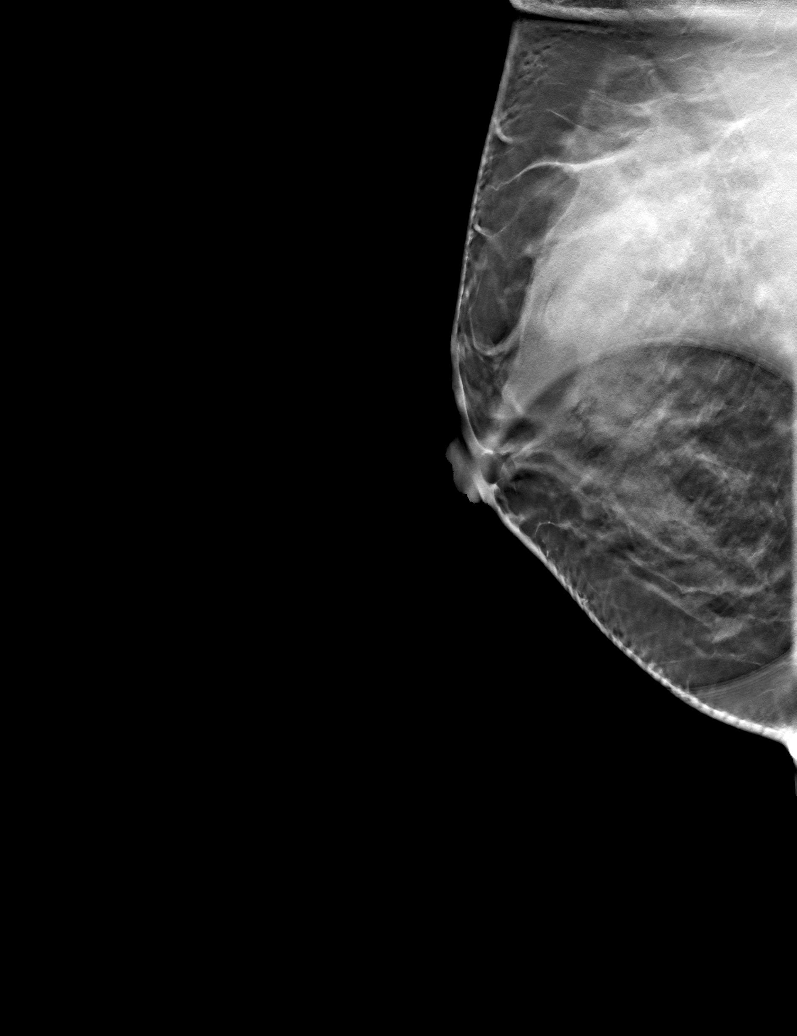

[6 of 14 positions shown; findings below may reference images not displayed]

ACR Breast Density Category d: The breast tissue is extremely dense,
which lowers the sensitivity of mammography.
FINDINGS: Focal spot compression views with 3D mammography were performed of
the lower central right breast. These images confirm an area of
architectural distortion in the central/slightly outer and inferior
right breast. Area of architectural distortion measures
approximately 2.6 cm in diameter.

Mammographic images were processed with CAD.

On physical exam, no mass is palpated in the inferior central right
breast.

Targeted ultrasound is performed, showing dense fibroglandular
tissue with scattered cysts. No architectural distortion or
suspicious mass is identified on ultrasound.

Ultrasound of the right axilla is negative for lymphadenopathy.
IMPRESSION: Architectural distortion in the central/slightly outer and inferior
right breast best seen on 3D images. No definite sonographic
correlate identified. Primary considerations include complex
sclerosing lesion or malignancy.

RECOMMENDATION:
Stereotactic biopsy of the area of architectural distortion is
recommended. This was discussed in detail with the patient today in
she has been scheduled for biopsy on February 18, 2016.

I have discussed the findings and recommendations with the patient.
Results were also provided in writing at the conclusion of the
visit. If applicable, a reminder letter will be sent to the patient
regarding the next appointment.

BI-RADS CATEGORY  4: Suspicious.

## 2020-01-28 ENCOUNTER — Other Ambulatory Visit: Payer: Self-pay | Admitting: Unknown Physician Specialty

## 2020-01-28 ENCOUNTER — Telehealth: Payer: Self-pay | Admitting: Unknown Physician Specialty

## 2020-01-28 DIAGNOSIS — U071 COVID-19: Secondary | ICD-10-CM

## 2020-01-28 DIAGNOSIS — I10 Essential (primary) hypertension: Secondary | ICD-10-CM

## 2020-01-28 NOTE — Telephone Encounter (Signed)
I connected by phone with Sierra Newman on 01/28/2020 at 3:26 PM to discuss the potential use of a new treatment for mild to moderate COVID-19 viral infection in non-hospitalized patients.  This patient is a 49 y.o. female that meets the FDA criteria for Emergency Use Authorization of COVID monoclonal antibody casirivimab/imdevimab.  Has a (+) direct SARS-CoV-2 viral test result  Has mild or moderate COVID-19   Is NOT hospitalized due to COVID-19  Is within 10 days of symptom onset  Has at least one of the high risk factor(s) for progression to severe COVID-19 and/or hospitalization as defined in EUA.  Specific high risk criteria : Cardiovascular disease or hypertension   I have spoken and communicated the following to the patient or parent/caregiver regarding COVID monoclonal antibody treatment:  1. FDA has authorized the emergency use for the treatment of mild to moderate COVID-19 in adults and pediatric patients with positive results of direct SARS-CoV-2 viral testing who are 66 years of age and older weighing at least 40 kg, and who are at high risk for progressing to severe COVID-19 and/or hospitalization.  2. The significant known and potential risks and benefits of COVID monoclonal antibody, and the extent to which such potential risks and benefits are unknown.  3. Information on available alternative treatments and the risks and benefits of those alternatives, including clinical trials.  4. Patients treated with COVID monoclonal antibody should continue to self-isolate and use infection control measures (e.g., wear mask, isolate, social distance, avoid sharing personal items, clean and disinfect "high touch" surfaces, and frequent handwashing) according to CDC guidelines.   5. The patient or parent/caregiver has the option to accept or refuse COVID monoclonal antibody treatment.  After reviewing this information with the patient, The patient agreed to proceed with receiving  casirivimab\imdevimab infusion and will be provided a copy of the Fact sheet prior to receiving the infusion. [name] 01/28/2020 3:26 PM  Sx onset 9/18 unvaccinated.  Diet controlled hypertension

## 2020-01-30 ENCOUNTER — Other Ambulatory Visit (HOSPITAL_COMMUNITY): Payer: Self-pay

## 2020-01-30 ENCOUNTER — Ambulatory Visit (HOSPITAL_COMMUNITY)
Admission: RE | Admit: 2020-01-30 | Discharge: 2020-01-30 | Disposition: A | Discharge status: Home / Self Care | Payer: HRSA Program | Source: Ambulatory Visit | Attending: Pulmonary Disease | Admitting: Pulmonary Disease

## 2020-01-30 DIAGNOSIS — I10 Essential (primary) hypertension: Secondary | ICD-10-CM | POA: Diagnosis present

## 2020-01-30 DIAGNOSIS — U071 COVID-19: Secondary | ICD-10-CM | POA: Diagnosis present

## 2020-01-30 MED ORDER — ALBUTEROL SULFATE HFA 108 (90 BASE) MCG/ACT IN AERS: 2.0000 | INHALATION_SPRAY | Freq: Once | RESPIRATORY_TRACT | Status: DC | PRN

## 2020-01-30 MED ORDER — SODIUM CHLORIDE 0.9 % IV SOLN
1200.0000 mg | Freq: Once | INTRAVENOUS | Status: AC
  Administered 2020-01-30: 1200 mg via INTRAVENOUS

## 2020-01-30 MED ORDER — SODIUM CHLORIDE 0.9 % IV SOLN: INTRAVENOUS | Status: DC | PRN

## 2020-01-30 MED ORDER — FAMOTIDINE IN NACL 20-0.9 MG/50ML-% IV SOLN: 20.0000 mg | Freq: Once | INTRAVENOUS | Status: DC | PRN

## 2020-01-30 MED ORDER — METHYLPREDNISOLONE SODIUM SUCC 125 MG IJ SOLR: 125.0000 mg | Freq: Once | INTRAMUSCULAR | Status: DC | PRN

## 2020-01-30 MED ORDER — EPINEPHRINE 0.3 MG/0.3ML IJ SOAJ: 0.3000 mg | Freq: Once | INTRAMUSCULAR | Status: DC | PRN

## 2020-01-30 MED ORDER — DIPHENHYDRAMINE HCL 50 MG/ML IJ SOLN: 50.0000 mg | Freq: Once | INTRAMUSCULAR | Status: DC | PRN

## 2020-01-30 NOTE — Progress Notes (Signed)
  Diagnosis: COVID-19  Physician: Asencion Noble, MD  Procedure: [facility] Med: casirivimab\imdevimab infusion - Provided patient with casirivimab\imdevimab fact sheet for patients, parents and caregivers prior to infusion.  Complications: No immediate complications noted.  Discharge: Discharged home   Sierra Newman 01/30/2020

## 2020-01-30 NOTE — Discharge Instructions (Signed)

## 2021-03-15 ENCOUNTER — Other Ambulatory Visit: Payer: Self-pay | Admitting: Obstetrics & Gynecology

## 2021-03-21 ENCOUNTER — Other Ambulatory Visit: Payer: Self-pay | Admitting: Family Medicine

## 2021-03-21 DIAGNOSIS — N6320 Unspecified lump in the left breast, unspecified quadrant: Secondary | ICD-10-CM

## 2021-04-07 DIAGNOSIS — C801 Malignant (primary) neoplasm, unspecified: Secondary | ICD-10-CM

## 2021-04-07 HISTORY — DX: Malignant (primary) neoplasm, unspecified: C80.1

## 2021-04-14 ENCOUNTER — Other Ambulatory Visit: Payer: Self-pay | Admitting: Radiology

## 2021-04-19 ENCOUNTER — Telehealth: Payer: Self-pay | Admitting: Hematology and Oncology

## 2021-04-19 NOTE — Telephone Encounter (Signed)
Scheduled appt per 12/13 staff msg from [employer]. Pt is aware of appt date and time.

## 2021-04-20 ENCOUNTER — Other Ambulatory Visit: Payer: Self-pay

## 2021-04-20 ENCOUNTER — Ambulatory Visit: Payer: No Typology Code available for payment source | Attending: General Surgery

## 2021-04-20 DIAGNOSIS — R293 Abnormal posture: Secondary | ICD-10-CM | POA: Insufficient documentation

## 2021-04-20 DIAGNOSIS — C50412 Malignant neoplasm of upper-outer quadrant of left female breast: Secondary | ICD-10-CM | POA: Insufficient documentation

## 2021-04-20 DIAGNOSIS — Z170 Estrogen receptor positive status [ER+]: Secondary | ICD-10-CM | POA: Insufficient documentation
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
## 2021-04-20 NOTE — Patient Instructions (Signed)
Physical Therapy Information for After Breast Cancer Surgery/Treatment:  Lymphedema is a swelling condition that you may be at risk for in your arm if you have lymph nodes removed from the armpit area.  After a sentinel node biopsy, the risk is approximately 5-9% and is higher after an axillary node dissection.  There is treatment available for this condition and it is not life-threatening.  Contact your physician or physical therapist with concerns. You may begin the 4 shoulder/posture exercises (see additional sheet) when permitted by your physician (typically a week after surgery).  If you have drains, you may need to wait until those are removed before beginning range of motion exercises.  A general recommendation is to not lift your arms above shoulder height until drains are removed.  These exercises should be done to your tolerance and gently.  This is not a "no pain/no gain" type of recovery so listen to your body and stretch into the range of motion that you can tolerate, stopping if you have pain.  If you are having immediate reconstruction, ask your plastic surgeon about doing exercises as he or she may want you to wait. We encourage you to attend the free one time ABC (After Breast Cancer) class offered by [facility].  You will learn information related to lymphedema risk, prevention and treatment and additional exercises to regain mobility following surgery.  You can call [phone] for more information.  This is offered the 1st and 3rd Monday of each month.  You only attend the class one time. While undergoing any medical procedure or treatment, try to avoid blood pressure being taken or needle sticks from occurring on the arm on the side of cancer.   This recommendation begins after surgery and continues for the rest of your life.  This may help reduce your risk of getting lymphedema (swelling in your arm). An excellent resource for those seeking information on  lymphedema is the National Lymphedema Network's web site. It can be accessed at [place].org If you notice swelling in your hand, arm or breast at any time following surgery (even if it is many years from now), please contact your doctor or physical therapist to discuss this.  Lymphedema can be treated at any time but it is easier for you if it is treated early on.  If you feel like your shoulder motion is not returning to normal in a reasonable amount of time, please contact your surgeon or physical therapist.  [facility] [place], [place], [place] [phone]; [address], [place], [place] [address] ABC CLASS After Breast Cancer Class  After Breast Cancer Class is a specially designed exercise class to assist you in a safe recover after having breast cancer surgery.  In this class you will learn how to get back to full function whether your drains were just removed or if you had surgery a month ago.  This one-time class is held the 1st and 3rd Monday of every month from 11:00 a.m. until 12:00 noon and is a virtual class  This class is FREE and space is limited. For more information or to register for the next available class, call [phone].  Class Goals  Understand specific stretches to improve the flexibility of you chest and shoulder. Learn ways to safely strengthen your upper body and improve your posture. Understand the warning signs of infection and why you may be at risk for an arm infection. Learn about Lymphedema and prevention.  ** You do not  attend this class until after surgery.  Drains must be removed to participate  Patient was instructed today in a home exercise program today for post op shoulder range of motion. These included active assist shoulder flexion in sitting, scapular retraction, wall walking with shoulder abduction, and hands behind head external rotation.  She was encouraged to do these twice a day, holding 3 seconds and repeating 5 times when permitted by her  physician.

## 2021-04-22 ENCOUNTER — Telehealth: Payer: Self-pay | Admitting: Licensed Clinical Social Worker

## 2021-04-22 ENCOUNTER — Telehealth: Payer: Self-pay

## 2021-04-22 ENCOUNTER — Other Ambulatory Visit: Payer: Self-pay | Admitting: General Surgery

## 2021-04-22 ENCOUNTER — Other Ambulatory Visit (HOSPITAL_COMMUNITY): Payer: Self-pay | Admitting: General Surgery

## 2021-04-22 DIAGNOSIS — C50412 Malignant neoplasm of upper-outer quadrant of left female breast: Secondary | ICD-10-CM

## 2021-04-22 NOTE — Telephone Encounter (Signed)
Pt called this AM, and left a VM regarding scheduling issues. Pt call returned this afternoon and pt rescheduled appointments. No further questions or concerns at this time.

## 2021-04-22 NOTE — Telephone Encounter (Signed)
Called pt about Genetic counseling appt per Humberto Leep. Pt did not answer, left msg for pt with appt date and time.

## 2021-04-25 ENCOUNTER — Inpatient Hospital Stay (HOSPITAL_BASED_OUTPATIENT_CLINIC_OR_DEPARTMENT_OTHER): Payer: Self-pay | Admitting: Licensed Clinical Social Worker

## 2021-04-25 ENCOUNTER — Inpatient Hospital Stay: Payer: No Typology Code available for payment source | Attending: Genetic Counselor

## 2021-04-25 ENCOUNTER — Other Ambulatory Visit: Payer: Self-pay | Admitting: Licensed Clinical Social Worker

## 2021-04-25 ENCOUNTER — Other Ambulatory Visit: Payer: Self-pay

## 2021-04-25 ENCOUNTER — Ambulatory Visit
Admission: RE | Admit: 2021-04-25 | Discharge: 2021-04-25 | Disposition: A | Discharge status: Home / Self Care | Payer: No Typology Code available for payment source | Source: Ambulatory Visit | Attending: General Surgery | Admitting: General Surgery

## 2021-04-25 ENCOUNTER — Encounter: Payer: Self-pay | Admitting: Licensed Clinical Social Worker

## 2021-04-25 DIAGNOSIS — Z79899 Other long term (current) drug therapy: Secondary | ICD-10-CM | POA: Insufficient documentation

## 2021-04-25 DIAGNOSIS — C50912 Malignant neoplasm of unspecified site of left female breast: Secondary | ICD-10-CM

## 2021-04-25 DIAGNOSIS — C50412 Malignant neoplasm of upper-outer quadrant of left female breast: Secondary | ICD-10-CM | POA: Insufficient documentation

## 2021-04-25 DIAGNOSIS — Z800 Family history of malignant neoplasm of digestive organs: Secondary | ICD-10-CM

## 2021-04-25 DIAGNOSIS — Z801 Family history of malignant neoplasm of trachea, bronchus and lung: Secondary | ICD-10-CM | POA: Insufficient documentation

## 2021-04-25 DIAGNOSIS — Z170 Estrogen receptor positive status [ER+]: Secondary | ICD-10-CM

## 2021-04-25 LAB — GENETIC SCREENING ORDER

## 2021-04-25 MED ORDER — GADOBUTROL 1 MMOL/ML IV SOLN
6.0000 mL | Freq: Once | INTRAVENOUS | Status: AC | PRN
  Administered 2021-04-25: 09:00:00 6 mL via INTRAVENOUS
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
## 2021-04-26 ENCOUNTER — Other Ambulatory Visit: Payer: No Typology Code available for payment source

## 2021-04-26 ENCOUNTER — Inpatient Hospital Stay (HOSPITAL_BASED_OUTPATIENT_CLINIC_OR_DEPARTMENT_OTHER): Payer: Self-pay | Admitting: Hematology and Oncology

## 2021-04-26 ENCOUNTER — Inpatient Hospital Stay: Payer: No Typology Code available for payment source | Admitting: Licensed Clinical Social Worker

## 2021-04-26 DIAGNOSIS — Z801 Family history of malignant neoplasm of trachea, bronchus and lung: Secondary | ICD-10-CM

## 2021-04-26 DIAGNOSIS — Z800 Family history of malignant neoplasm of digestive organs: Secondary | ICD-10-CM

## 2021-04-26 DIAGNOSIS — C50412 Malignant neoplasm of upper-outer quadrant of left female breast: Secondary | ICD-10-CM

## 2021-04-26 DIAGNOSIS — Z170 Estrogen receptor positive status [ER+]: Secondary | ICD-10-CM

## 2021-04-26 DIAGNOSIS — Z79899 Other long term (current) drug therapy: Secondary | ICD-10-CM

## 2021-04-26 NOTE — Assessment & Plan Note (Signed)
MRI Breast on 04/25/2021 showed 3.0 x 2.0 x 1.8 cm enhancing mass in the upper left breast consistent with the patient's biopsy-proven malignancy, and suspicious 1.1 cm mass in the far posterior lower outer quadrant of the right breast abutting the chest wall.  Biopsy on 04/14/2021 showed grade 1/2 invasive ductal carcinoma and DCIS, Her2-/ER+(80%)/PR+(80%).   Pathology and radiology counseling:Discussed with the patient, the details of pathology including the type of breast cancer,the clinical staging, the significance of ER, PR and HER-2/neu receptors and the implications for treatment. After reviewing the pathology in detail, we proceeded to discuss the different treatment options between surgery, radiation, chemotherapy, antiestrogen therapies.  Recommendations: 1. Breast conserving surgery followed by 2. Oncotype DX testing to determine if chemotherapy would be of any benefit followed by 3. Adjuvant radiation therapy followed by 4. Adjuvant antiestrogen therapy  Oncotype counseling: I discussed Oncotype DX test. I explained to the patient that this is a 21 gene panel to evaluate patient tumors DNA to calculate recurrence score. This would help determine whether patient has high risk or low risk breast cancer. She understands that if her tumor was found to be high risk, she would benefit from systemic chemotherapy. If low risk, no need of chemotherapy.  Return to clinic after surgery to discuss final pathology report

## 2021-04-26 NOTE — Addendum Note (Signed)
Addended by: Lynnae Sandhoff on: 04/26/2021 04:06 PM   Modules accepted: Orders

## 2021-04-28 ENCOUNTER — Other Ambulatory Visit: Payer: Self-pay | Admitting: General Surgery

## 2021-04-28 DIAGNOSIS — N6313 Unspecified lump in the right breast, lower outer quadrant: Secondary | ICD-10-CM

## 2021-05-03 ENCOUNTER — Telehealth: Payer: Self-pay | Admitting: Genetic Counselor

## 2021-05-03 NOTE — Telephone Encounter (Signed)
Negative BRCAPlus panel testing.  The remainder of testing will be back in another 1-2 weeks.

## 2021-05-10 ENCOUNTER — Telehealth: Payer: Self-pay | Admitting: Hematology and Oncology

## 2021-05-10 ENCOUNTER — Other Ambulatory Visit: Payer: Self-pay | Admitting: General Surgery

## 2021-05-10 ENCOUNTER — Ambulatory Visit: Payer: No Typology Code available for payment source | Admitting: Hematology and Oncology

## 2021-05-10 NOTE — Telephone Encounter (Signed)
Rescheduled 01/6 appointment to 01/03, patient is notified.

## 2021-05-10 NOTE — Telephone Encounter (Signed)
Scheduled per 01/03 scheduled message, patient did not confirm if she could come in 01/03 so I scheduled for 01/06. Called and left a voicemail.

## 2021-05-11 ENCOUNTER — Other Ambulatory Visit: Payer: No Typology Code available for payment source

## 2021-05-12 ENCOUNTER — Telehealth: Payer: Self-pay | Admitting: *Deleted

## 2021-05-12 ENCOUNTER — Encounter: Payer: Self-pay | Admitting: *Deleted

## 2021-05-12 NOTE — Telephone Encounter (Signed)
Left message for a return phone call to follow up from new patient appt and assess navigation needs. Contact information given.

## 2021-05-13 ENCOUNTER — Ambulatory Visit: Payer: No Typology Code available for payment source | Admitting: Hematology and Oncology

## 2021-05-13 ENCOUNTER — Other Ambulatory Visit: Payer: Self-pay | Admitting: Radiology

## 2021-05-16 ENCOUNTER — Ambulatory Visit: Payer: Self-pay | Admitting: Licensed Clinical Social Worker

## 2021-05-16 ENCOUNTER — Telehealth: Payer: Self-pay | Admitting: Licensed Clinical Social Worker

## 2021-05-16 ENCOUNTER — Encounter: Payer: Self-pay | Admitting: Licensed Clinical Social Worker

## 2021-05-16 DIAGNOSIS — Z170 Estrogen receptor positive status [ER+]: Secondary | ICD-10-CM

## 2021-05-16 DIAGNOSIS — Z1379 Encounter for other screening for genetic and chromosomal anomalies: Secondary | ICD-10-CM

## 2021-05-16 DIAGNOSIS — C50412 Malignant neoplasm of upper-outer quadrant of left female breast: Secondary | ICD-10-CM

## 2021-05-16 NOTE — Progress Notes (Signed)
HPI:  Ms. Tullo was previously seen in the [place] clinic due to a personal and family history of cancer and concerns regarding a hereditary predisposition to cancer. Please refer to our prior cancer genetics clinic note for more information regarding our discussion, assessment and recommendations, at the time. Ms. Jacob's recent genetic test results were disclosed to her, as were recommendations warranted by these results. These results and recommendations are discussed in more detail below.  CANCER HISTORY:  Oncology History  Malignant neoplasm of upper-outer quadrant of left breast in female, estrogen receptor positive ([place])  04/14/2021 Initial Diagnosis   Palpable lump in the left breast, ultrasound and biopsy revealed grade 1-2 IDC with DCIS ER 80%, PR 80%, HER2 2+ by IHC FISH negative, Ki-67 15%   04/25/2021 Breast MRI   3.0 x 2.0 x 1.8 cm enhancing mass in the upper left breast consistent with the patient's biopsy-proven malignancy, and suspicious 1.1 cm mass in the far posterior lower outer quadrant of the right breast abutting the chest wall.     Genetic Testing   Negative genetic testing. No pathogenic variants identified on the [facility] CancerNext-Expanded+RNA panel. The report date is 05/13/2021.  The CancerNext-Expanded + RNAinsight gene panel offered by [employer] and includes sequencing and rearrangement analysis for the following 77 genes: IP, ALK, APC*, ATM*, AXIN2, BAP1, BARD1, BLM, BMPR1A, BRCA1*, BRCA2*, BRIP1*, CDC73, CDH1*,CDK4, CDKN1B, CDKN2A, CHEK2*, CTNNA1, DICER1, FANCC, FH, FLCN, GALNT12, KIF1B, LZTR1, MAX, MEN1, MET, MLH1*, MSH2*, MSH3, MSH6*, MUTYH*, NBN, NF1*, NF2, NTHL1, PALB2*, PHOX2B, PMS2*, POT1, PRKAR1A, PTCH1, PTEN*, RAD51C*, RAD51D*,RB1, RECQL, RET, SDHA, SDHAF2, SDHB, SDHC, SDHD, SMAD4, SMARCA4, SMARCB1, SMARCE1, STK11, SUFU, TMEM127, TP53*,TSC1, TSC2, VHL and XRCC2 (sequencing and deletion/duplication); EGFR, EGLN1, HOXB13, KIT, MITF,  PDGFRA, POLD1 and POLE (sequencing only); EPCAM and GREM1 (deletion/duplication only).     FAMILY HISTORY:  We obtained a detailed, 4-generation family history.  Significant diagnoses are listed below: Family History  Problem Relation Age of Onset   Lung cancer Mother 50       no history of smoking   Stomach cancer Maternal Grandfather        d. 46s   Ms. Malena has 1 son (25) no cancer history. She has 1 maternal half brother (69) and  maternal half sister (15) no history of cancer. She also has a paternal half brother and sister but has no information about them.    Ms. Tigges mother died at 18 of lung cancer, she had no history of smoking. Patient has 3 maternal aunts, 2 uncles, no cancers for them or for her first cousins. Maternal grandfather died in his 24s of stomach cancer and grandmother died at 26.   Ms. Ellis is estranged from her father and has limited information about his side of the family, but is unaware of any cancer.   Ms. Hopson is unaware of previous family history of genetic testing for hereditary cancer risks.  There is no reported Ashkenazi Jewish ancestry. There is no known consanguinity.      GENETIC TEST RESULTS: Genetic testing reported out on 05/02/2021 through the [place] cancer panel and on 05/13/2021 through the [facility] CancerNext-Expanded+RNA panel found no pathogenic mutations.   The CancerNext-Expanded + RNAinsight gene panel offered by [employer] and includes sequencing and rearrangement analysis for the following 77 genes: IP, ALK, APC*, ATM*, AXIN2, BAP1, BARD1, BLM, BMPR1A, BRCA1*, BRCA2*, BRIP1*, CDC73, CDH1*,CDK4, CDKN1B, CDKN2A, CHEK2*, CTNNA1, DICER1, FANCC, FH, FLCN, GALNT12, KIF1B, LZTR1, MAX, MEN1, MET, MLH1*, MSH2*,  MSH3, MSH6*, MUTYH*, NBN, NF1*, NF2, NTHL1, PALB2*, PHOX2B, PMS2*, POT1, PRKAR1A, PTCH1, PTEN*, RAD51C*, RAD51D*,RB1, RECQL, RET, SDHA, SDHAF2, SDHB, SDHC, SDHD, SMAD4, SMARCA4, SMARCB1, SMARCE1, STK11, SUFU, TMEM127,  TP53*,TSC1, TSC2, VHL and XRCC2 (sequencing and deletion/duplication); EGFR, EGLN1, HOXB13, KIT, MITF, PDGFRA, POLD1 and POLE (sequencing only); EPCAM and GREM1 (deletion/duplication only).  The test report has been scanned into EPIC and is located under the Molecular Pathology section of the Results Review tab.  A portion of the result report is included below for reference.     We discussed that because current genetic testing is not perfect, it is possible there may be a gene mutation in one of these genes that current testing cannot detect, but that chance is small.  There could be another gene that has not yet been discovered, or that we have not yet tested, that is responsible for the cancer diagnoses in the family. It is also possible there is a hereditary cause for the cancer in the family that Ms. Dicamillo did not inherit and therefore was not identified in her testing.  Therefore, it is important to remain in touch with cancer genetics in the future so that we can continue to offer Ms. Mcelhiney the most up to date genetic testing.   ADDITIONAL GENETIC TESTING: We discussed with Ms. Stalnaker that her genetic testing was fairly extensive.  If there are genes identified to increase cancer risk that can be analyzed in the future, we would be happy to discuss and coordinate this testing at that time.    CANCER SCREENING RECOMMENDATIONS: Ms. Yam test result is considered negative (normal).  This means that we have not identified a hereditary cause for her  personal and family history of cancer at this time. Most cancers happen by chance and this negative test suggests that her cancer may fall into this category.    While reassuring, this does not definitively rule out a hereditary predisposition to cancer. It is still possible that there could be genetic mutations that are undetectable by current technology. There could be genetic mutations in genes that have not been tested or identified  to increase cancer risk.  Therefore, it is recommended she continue to follow the cancer management and screening guidelines provided by her oncology and primary healthcare provider.   An individual's cancer risk and medical management are not determined by genetic test results alone. Overall cancer risk assessment incorporates additional factors, including personal medical history, family history, and any available genetic information that may result in a personalized plan for cancer prevention and surveillance.  RECOMMENDATIONS FOR FAMILY MEMBERS:  Relatives in this family might be at some increased risk of developing cancer, over the general population risk, simply due to the family history of cancer.  We recommended female relatives in this family have a yearly mammogram beginning at age 79, or 69 years younger than the earliest onset of cancer, an annual clinical breast exam, and perform monthly breast self-exams. Female relatives in this family should also have a gynecological exam as recommended by their primary provider.  All family members should be referred for colonoscopy starting at age 56.   FOLLOW-UP: Lastly, we discussed with Ms. Palau that cancer genetics is a rapidly advancing field and it is possible that new genetic tests will be appropriate for her and/or her family members in the future. We encouraged her to remain in contact with cancer genetics on an annual basis so we can update her personal and family histories and let her  know of advances in cancer genetics that may benefit this family.   Our contact number was provided. Ms. [place]'s questions were answered to her satisfaction, and she knows she is welcome to call us at anytime with additional questions or concerns.   Faith Rogue, MS, [facility] Genetic Counselor [email or web address] Phone: [phone]

## 2021-05-16 NOTE — Telephone Encounter (Signed)
Revealed negative genetic testing.  This normal result is reassuring and indicates that it is unlikely Sierra Newman's cancer is due to a hereditary cause.  It is unlikely that there is an increased risk of another cancer due to a mutation in one of these genes.  However, genetic testing is not perfect, and cannot definitively rule out a hereditary cause.  It will be important for her to keep in contact with genetics to learn if any additional testing may be needed in the future.

## 2021-05-19 ENCOUNTER — Other Ambulatory Visit: Payer: Self-pay | Admitting: General Surgery

## 2021-05-25 ENCOUNTER — Other Ambulatory Visit: Payer: Self-pay

## 2021-05-25 ENCOUNTER — Encounter: Payer: Self-pay | Admitting: *Deleted

## 2021-05-25 ENCOUNTER — Encounter (HOSPITAL_BASED_OUTPATIENT_CLINIC_OR_DEPARTMENT_OTHER): Payer: Self-pay | Admitting: General Surgery

## 2021-05-26 ENCOUNTER — Ambulatory Visit: Payer: No Typology Code available for payment source

## 2021-05-30 NOTE — H&P (Signed)
Subjective:     Patient ID: Sierra Newman is a 51 y.o. female.   HPI    Returns for follow up discussion breast reconstruction. Presented with palpable left breast mass. MMG showed right breast calcs, a 3.4 cm distortion in the left breast UOQ. US showed a 2.5 cm left breast mass, normal axilla. Biopsy left breast showed IDC with DCIS , ER/PR+, HER2-.   MRI showed a 3.0 x 2.0 x 1.8 cm mass in the left breast consistent with the patient's biopsy-proven malignancy. A 1.1 cm mass in the right LOQ abutting the chest wall noted. Second-look [place] right breast with biopsy labeled 8 o clock showing IDC, ER/PR+, Her2-.    Plan bilateral mastectomies.   Genetics negative. Plan oncotype .   History prior right breast radial scar excision.   Current 34 B, happy with this. Wt stable   Lives with spouse and 19 yo son.   Review of Systems Remainder 12 point review negative    Objective:   Physical Exam Cardiovascular:     Rate and Rhythm: Normal rate and regular rhythm.     Heart sounds: Normal heart sounds.  Pulmonary:     Effort: Pulmonary effort is normal.     Breath sounds: Normal breath sounds.  Abdominal:     Comments: Counseled abdominal soft tissue may allow for unilateral reconstruction  Lymphadenopathy:     Upper Body:     Right upper body: No axillary adenopathy.     Left upper body: No axillary adenopathy.  Skin:    Comments: Fitzpatrick 2     Breasts: Left breast upper pole mass No ptosis SN to nipple R 20 L 20 cm BW R 18 L 18 cm CW 14 cm Nipple to IMF R 7 L 7 cm      Assessment:     Left breast cancer UOQ ER+, right breast cancer LOQ ER+    Plan:     Plan bilateral NSM with tissue expander reconstruction.   Reviewed incisions, drains, OR length, hospital stay and recovery. Reviewed process of expansion and implant based risks including rupture, imaging surveillance for silicone implants, infection requiring surgery or removal, contracture. Discussed future  surgery dependent on adjuvant treatments.   Reviewed reconstruction will be asensate and not stimulate. Reviewed risks mastectomy flap necrosis requiring additional surgery.    Discussed use of acellular dermis in reconstruction, cadaveric source, incorporation over several weeks, risk that if has seroma or infection can act as additional nidus for infection if not incorporated. Reviewed this is an off label use of acellular dermis.    Reviewed prepectoral vs sub pectoral reconstruction. Discussed with patient and benefit of this is no animation deformity, may be less pain. Risk may be more visible rippling over upper poles, greater need of ADM. Reviewed pre pectoral would require larger amount acellular dermis, more drains. Discussed any type reconstruction also risks long term displacement implant and visible rippling. If prepectoral counseled I would recommend she be comfortable with silicone implants as more options that have less rippling. She agrees to prepectoral placement.   Additional risks including but not limited to bleeding, hematoma, seroma, damage to adjacent structures, need for additional procedures, blood clots in legs or lungs reviewed.   Drain teaching completed. Rx for [place] given. Rx for oxycodone Robaxin and Bactrim given.

## 2021-06-02 ENCOUNTER — Encounter (HOSPITAL_BASED_OUTPATIENT_CLINIC_OR_DEPARTMENT_OTHER)
Admission: RE | Admit: 2021-06-02 | Discharge: 2021-06-02 | Disposition: A | Discharge status: Home / Self Care | Payer: No Typology Code available for payment source | Source: Ambulatory Visit | Attending: General Surgery | Admitting: General Surgery

## 2021-06-02 DIAGNOSIS — Z01810 Encounter for preprocedural cardiovascular examination: Secondary | ICD-10-CM | POA: Insufficient documentation

## 2021-06-02 MED ORDER — ENSURE PRE-SURGERY PO LIQD: 296.0000 mL | Freq: Once | ORAL | Status: DC

## 2021-06-02 NOTE — Progress Notes (Signed)

## 2021-06-06 NOTE — Anesthesia Preprocedure Evaluation (Addendum)
Anesthesia Evaluation  Patient identified by MRN, date of birth, ID band Patient awake    Reviewed: Allergy & Precautions, NPO status , Patient's Chart, lab work & pertinent test results  Airway Mallampati: II  TM Distance: >3 FB Neck ROM: Full    Dental no notable dental hx. (+) Teeth Intact, Dental Advisory Given, Caps   Pulmonary neg pulmonary ROS,    Pulmonary exam normal breath sounds clear to auscultation       Cardiovascular hypertension, Pt. on medications negative cardio ROS Normal cardiovascular exam Rhythm:Regular Rate:Normal     Neuro/Psych PSYCHIATRIC DISORDERS Anxiety Depression negative neurological ROS  negative psych ROS   GI/Hepatic negative GI ROS, Neg liver ROS,   Endo/Other  negative endocrine ROS  Renal/GU negative Renal ROS  negative genitourinary   Musculoskeletal negative musculoskeletal ROS (+)   Abdominal   Peds negative pediatric ROS (+)  Hematology negative hematology ROS (+)   Anesthesia Other Findings   Reproductive/Obstetrics negative OB ROS                            Anesthesia Physical  Anesthesia Plan  ASA: 2  Anesthesia Plan: General and Regional   Post-op Pain Management: Toradol IV (intra-op), Ofirmev IV (intra-op) and Regional block   Induction: Intravenous  PONV Risk Score and Plan: 3 and Ondansetron, Dexamethasone, Treatment may vary due to age or medical condition and Midazolam  Airway Management Planned: LMA and Oral ETT  Additional Equipment: None  Intra-op Plan:   Post-operative Plan: Extubation in OR  Informed Consent: I have reviewed the patients History and Physical, chart, labs and discussed the procedure including the risks, benefits and alternatives for the proposed anesthesia with the patient or authorized representative who has indicated his/her understanding and acceptance.     Dental advisory given  Plan Discussed  with: CRNA and Anesthesiologist  Anesthesia Plan Comments: ( )        Anesthesia Quick Evaluation

## 2021-06-07 ENCOUNTER — Other Ambulatory Visit: Payer: Self-pay

## 2021-06-07 ENCOUNTER — Encounter (HOSPITAL_BASED_OUTPATIENT_CLINIC_OR_DEPARTMENT_OTHER)
Admission: RE | Disposition: A | Discharge status: Home / Self Care | Payer: Self-pay | Source: Ambulatory Visit | Attending: General Surgery

## 2021-06-07 ENCOUNTER — Ambulatory Visit (HOSPITAL_BASED_OUTPATIENT_CLINIC_OR_DEPARTMENT_OTHER): Payer: No Typology Code available for payment source | Admitting: Anesthesiology

## 2021-06-07 ENCOUNTER — Observation Stay (HOSPITAL_BASED_OUTPATIENT_CLINIC_OR_DEPARTMENT_OTHER)
Admission: RE | Admit: 2021-06-07 | Discharge: 2021-06-08 | Disposition: A | Discharge status: Home / Self Care | Payer: No Typology Code available for payment source | Source: Ambulatory Visit | Attending: General Surgery | Admitting: General Surgery

## 2021-06-07 ENCOUNTER — Encounter (HOSPITAL_BASED_OUTPATIENT_CLINIC_OR_DEPARTMENT_OTHER): Payer: Self-pay | Admitting: General Surgery

## 2021-06-07 DIAGNOSIS — C50511 Malignant neoplasm of lower-outer quadrant of right female breast: Secondary | ICD-10-CM | POA: Insufficient documentation

## 2021-06-07 DIAGNOSIS — Z170 Estrogen receptor positive status [ER+]: Secondary | ICD-10-CM | POA: Insufficient documentation

## 2021-06-07 DIAGNOSIS — C50412 Malignant neoplasm of upper-outer quadrant of left female breast: Principal | ICD-10-CM | POA: Insufficient documentation

## 2021-06-07 DIAGNOSIS — Z9013 Acquired absence of bilateral breasts and nipples: Secondary | ICD-10-CM

## 2021-06-07 HISTORY — PX: NIPPLE SPARING MASTECTOMY: SHX6537

## 2021-06-07 HISTORY — PX: BREAST RECONSTRUCTION WITH PLACEMENT OF TISSUE EXPANDER AND ALLODERM: SHX6805

## 2021-06-07 HISTORY — PX: NIPPLE SPARING MASTECTOMY WITH SENTINEL LYMPH NODE BIOPSY: SHX6826

## 2021-06-07 LAB — POCT PREGNANCY, URINE: Preg Test, Ur: NEGATIVE

## 2021-06-07 SURGERY — NIPPLE SPARING MASTECTOMY WITH SENTINEL LYMPH NODE BIOPSY
Anesthesia: Regional | Site: Breast | Laterality: Right

## 2021-06-07 MED ORDER — CHLORHEXIDINE GLUCONATE CLOTH 2 % EX PADS: 6.0000 | MEDICATED_PAD | Freq: Once | CUTANEOUS | Status: DC

## 2021-06-07 MED ORDER — LACTATED RINGERS IV SOLN: INTRAVENOUS | Status: DC

## 2021-06-07 MED ORDER — PROPOFOL 10 MG/ML IV BOLUS
INTRAVENOUS | Status: AC
  Filled 2021-06-07: qty 20

## 2021-06-07 MED ORDER — PHENYLEPHRINE 40 MCG/ML (10ML) SYRINGE FOR IV PUSH (FOR BLOOD PRESSURE SUPPORT)
PREFILLED_SYRINGE | INTRAVENOUS | Status: AC
  Filled 2021-06-07: qty 10

## 2021-06-07 MED ORDER — SODIUM CHLORIDE 0.9 % IV SOLN
INTRAVENOUS | Status: AC
  Filled 2021-06-07 (×2): qty 10

## 2021-06-07 MED ORDER — MEPERIDINE HCL 25 MG/ML IJ SOLN: 6.2500 mg | INTRAMUSCULAR | Status: DC | PRN

## 2021-06-07 MED ORDER — BUPIVACAINE HCL (PF) 0.5 % IJ SOLN
INTRAMUSCULAR | Status: AC
  Filled 2021-06-07: qty 30

## 2021-06-07 MED ORDER — POVIDONE-IODINE 10 % EX SOLN
CUTANEOUS | Status: DC | PRN
  Administered 2021-06-07: 1 via TOPICAL

## 2021-06-07 MED ORDER — ACETAMINOPHEN 500 MG PO TABS
1000.0000 mg | ORAL_TABLET | ORAL | Status: AC
  Administered 2021-06-07: 1000 mg via ORAL

## 2021-06-07 MED ORDER — FENTANYL CITRATE (PF) 100 MCG/2ML IJ SOLN
INTRAMUSCULAR | Status: AC
  Filled 2021-06-07: qty 2

## 2021-06-07 MED ORDER — ROCURONIUM BROMIDE 10 MG/ML (PF) SYRINGE
PREFILLED_SYRINGE | INTRAVENOUS | Status: AC
  Filled 2021-06-07: qty 10

## 2021-06-07 MED ORDER — PHENYLEPHRINE HCL-NACL 20-0.9 MG/250ML-% IV SOLN
INTRAVENOUS | Status: DC | PRN
  Administered 2021-06-07: 50 ug/min via INTRAVENOUS

## 2021-06-07 MED ORDER — PHENYLEPHRINE HCL (PRESSORS) 10 MG/ML IV SOLN
INTRAVENOUS | Status: AC
  Filled 2021-06-07: qty 1

## 2021-06-07 MED ORDER — ONDANSETRON HCL 4 MG/2ML IJ SOLN: 4.0000 mg | Freq: Four times a day (QID) | INTRAMUSCULAR | Status: DC | PRN

## 2021-06-07 MED ORDER — ONDANSETRON HCL 4 MG/2ML IJ SOLN
INTRAMUSCULAR | Status: DC | PRN
Start: 2021-06-07 — End: 2021-06-07
  Administered 2021-06-07: 4 mg via INTRAVENOUS

## 2021-06-07 MED ORDER — OXYCODONE HCL 5 MG PO TABS: 5.0000 mg | ORAL_TABLET | Freq: Once | ORAL | Status: DC | PRN

## 2021-06-07 MED ORDER — BUPIVACAINE HCL (PF) 0.25 % IJ SOLN
INTRAMUSCULAR | Status: AC
  Filled 2021-06-07: qty 30

## 2021-06-07 MED ORDER — CEFAZOLIN SODIUM-DEXTROSE 2-4 GM/100ML-% IV SOLN
INTRAVENOUS | Status: AC
  Filled 2021-06-07: qty 100

## 2021-06-07 MED ORDER — BUPIVACAINE-EPINEPHRINE (PF) 0.25% -1:200000 IJ SOLN
INTRAMUSCULAR | Status: DC | PRN
  Administered 2021-06-07 (×2): 20 mL via PERINEURAL

## 2021-06-07 MED ORDER — PROPOFOL 10 MG/ML IV BOLUS
INTRAVENOUS | Status: DC | PRN
  Administered 2021-06-07: 150 mg via INTRAVENOUS

## 2021-06-07 MED ORDER — ENSURE PRE-SURGERY PO LIQD: 296.0000 mL | Freq: Once | ORAL | Status: DC

## 2021-06-07 MED ORDER — ONDANSETRON 4 MG PO TBDP: 4.0000 mg | ORAL_TABLET | Freq: Four times a day (QID) | ORAL | Status: DC | PRN

## 2021-06-07 MED ORDER — DEXAMETHASONE SODIUM PHOSPHATE 4 MG/ML IJ SOLN
INTRAMUSCULAR | Status: DC | PRN
  Administered 2021-06-07: 8 mg via INTRAVENOUS

## 2021-06-07 MED ORDER — CEFAZOLIN SODIUM-DEXTROSE 2-4 GM/100ML-% IV SOLN: 2.0000 g | INTRAVENOUS | Status: DC

## 2021-06-07 MED ORDER — SODIUM CHLORIDE (PF) 0.9 % IJ SOLN: INTRAMUSCULAR | Status: DC | PRN

## 2021-06-07 MED ORDER — SERTRALINE HCL 50 MG PO TABS
50.0000 mg | ORAL_TABLET | Freq: Every day | ORAL | Status: DC
  Filled 2021-06-07: qty 1

## 2021-06-07 MED ORDER — SUGAMMADEX SODIUM 200 MG/2ML IV SOLN
INTRAVENOUS | Status: DC | PRN
  Administered 2021-06-07: 125 mg via INTRAVENOUS

## 2021-06-07 MED ORDER — OXYCODONE HCL 5 MG PO TABS
5.0000 mg | ORAL_TABLET | ORAL | Status: DC | PRN
  Administered 2021-06-07: 10 mg via ORAL
  Administered 2021-06-07 – 2021-06-08 (×2): 5 mg via ORAL
  Filled 2021-06-07 (×2): qty 1
  Filled 2021-06-07: qty 2

## 2021-06-07 MED ORDER — FENTANYL CITRATE (PF) 100 MCG/2ML IJ SOLN
INTRAMUSCULAR | Status: DC | PRN
  Administered 2021-06-07: 50 ug via INTRAVENOUS
  Administered 2021-06-07: 25 ug via INTRAVENOUS

## 2021-06-07 MED ORDER — ONDANSETRON HCL 4 MG/2ML IJ SOLN: 4.0000 mg | Freq: Once | INTRAMUSCULAR | Status: DC | PRN

## 2021-06-07 MED ORDER — PHENYLEPHRINE HCL (PRESSORS) 10 MG/ML IV SOLN
INTRAVENOUS | Status: DC | PRN
  Administered 2021-06-07 (×2): 80 ug via INTRAVENOUS
  Administered 2021-06-07: 120 ug via INTRAVENOUS
  Administered 2021-06-07: 80 ug via INTRAVENOUS

## 2021-06-07 MED ORDER — METHOCARBAMOL 500 MG PO TABS
500.0000 mg | ORAL_TABLET | Freq: Four times a day (QID) | ORAL | Status: DC | PRN
  Administered 2021-06-07 – 2021-06-08 (×3): 500 mg via ORAL
  Filled 2021-06-07 (×3): qty 1

## 2021-06-07 MED ORDER — GABAPENTIN 100 MG PO CAPS
100.0000 mg | ORAL_CAPSULE | Freq: Every day | ORAL | Status: DC
  Administered 2021-06-07: 100 mg via ORAL

## 2021-06-07 MED ORDER — CELECOXIB 200 MG PO CAPS
400.0000 mg | ORAL_CAPSULE | ORAL | Status: AC
  Administered 2021-06-07: 400 mg via ORAL

## 2021-06-07 MED ORDER — BUPIVACAINE LIPOSOME 1.3 % IJ SUSP
INTRAMUSCULAR | Status: DC | PRN
Start: 2021-06-07 — End: 2021-06-07
  Administered 2021-06-07 (×2): 10 mL

## 2021-06-07 MED ORDER — MIDAZOLAM HCL 2 MG/2ML IJ SOLN
INTRAMUSCULAR | Status: AC
  Filled 2021-06-07: qty 2

## 2021-06-07 MED ORDER — ROCURONIUM BROMIDE 100 MG/10ML IV SOLN
INTRAVENOUS | Status: DC | PRN
  Administered 2021-06-07: 100 mg via INTRAVENOUS

## 2021-06-07 MED ORDER — DEXAMETHASONE SODIUM PHOSPHATE 10 MG/ML IJ SOLN
INTRAMUSCULAR | Status: AC
  Filled 2021-06-07: qty 1

## 2021-06-07 MED ORDER — CEFAZOLIN SODIUM-DEXTROSE 2-4 GM/100ML-% IV SOLN
2.0000 g | INTRAVENOUS | Status: AC
  Administered 2021-06-07: 2 g via INTRAVENOUS

## 2021-06-07 MED ORDER — ACETAMINOPHEN 160 MG/5ML PO SOLN: 325.0000 mg | ORAL | Status: DC | PRN

## 2021-06-07 MED ORDER — MIDAZOLAM HCL 2 MG/2ML IJ SOLN
2.0000 mg | Freq: Once | INTRAMUSCULAR | Status: AC
  Administered 2021-06-07: 2 mg via INTRAVENOUS

## 2021-06-07 MED ORDER — MORPHINE SULFATE (PF) 4 MG/ML IV SOLN
1.0000 mg | INTRAVENOUS | Status: DC | PRN
  Administered 2021-06-07: 1 mg via INTRAVENOUS
  Filled 2021-06-07: qty 1

## 2021-06-07 MED ORDER — FENTANYL CITRATE (PF) 100 MCG/2ML IJ SOLN
100.0000 ug | Freq: Once | INTRAMUSCULAR | Status: AC
  Administered 2021-06-07: 100 ug via INTRAVENOUS

## 2021-06-07 MED ORDER — SODIUM CHLORIDE 0.9 % IV SOLN: INTRAVENOUS | Status: DC

## 2021-06-07 MED ORDER — ALPRAZOLAM 0.25 MG PO TABS
0.2500 mg | ORAL_TABLET | Freq: Every evening | ORAL | Status: DC | PRN
  Administered 2021-06-07: 0.25 mg via ORAL
  Filled 2021-06-07: qty 1

## 2021-06-07 MED ORDER — 0.9 % SODIUM CHLORIDE (POUR BTL) OPTIME
TOPICAL | Status: DC | PRN
  Administered 2021-06-07: 1000 mL

## 2021-06-07 MED ORDER — ACETAMINOPHEN 325 MG PO TABS: 325.0000 mg | ORAL_TABLET | ORAL | Status: DC | PRN

## 2021-06-07 MED ORDER — FENTANYL CITRATE (PF) 100 MCG/2ML IJ SOLN
25.0000 ug | INTRAMUSCULAR | Status: DC | PRN
  Administered 2021-06-07 (×5): 25 ug via INTRAVENOUS

## 2021-06-07 MED ORDER — OXYCODONE HCL 5 MG/5ML PO SOLN: 5.0000 mg | Freq: Once | ORAL | Status: DC | PRN

## 2021-06-07 MED ORDER — MAGTRACE LYMPHATIC TRACER
INTRAMUSCULAR | Status: DC | PRN
  Administered 2021-06-07: 2 mL via INTRAMUSCULAR

## 2021-06-07 MED ORDER — FLUTICASONE PROPIONATE 50 MCG/ACT NA SUSP: 1.0000 | Freq: Every day | NASAL | Status: DC

## 2021-06-07 MED ORDER — ONDANSETRON HCL 4 MG/2ML IJ SOLN
INTRAMUSCULAR | Status: AC
  Filled 2021-06-07: qty 2

## 2021-06-07 MED ORDER — LAMOTRIGINE 150 MG PO TABS
150.0000 mg | ORAL_TABLET | Freq: Every day | ORAL | Status: DC
  Filled 2021-06-07: qty 1

## 2021-06-07 MED ORDER — SODIUM CHLORIDE 0.9 % IV SOLN
INTRAVENOUS | Status: DC | PRN
  Administered 2021-06-07: 500 mL

## 2021-06-07 MED ORDER — SIMETHICONE 80 MG PO CHEW: 40.0000 mg | CHEWABLE_TABLET | Freq: Four times a day (QID) | ORAL | Status: DC | PRN

## 2021-06-07 MED ORDER — ACETAMINOPHEN 500 MG PO TABS
1000.0000 mg | ORAL_TABLET | Freq: Four times a day (QID) | ORAL | Status: DC
  Administered 2021-06-07 – 2021-06-08 (×2): 1000 mg via ORAL
  Filled 2021-06-07 (×2): qty 2

## 2021-06-07 MED ORDER — GABAPENTIN 100 MG PO CAPS
ORAL_CAPSULE | ORAL | Status: AC
  Filled 2021-06-07: qty 1

## 2021-06-07 MED ORDER — PROPOFOL 500 MG/50ML IV EMUL
INTRAVENOUS | Status: DC | PRN
Start: 2021-06-07 — End: 2021-06-07
  Administered 2021-06-07: 25 ug/kg/min via INTRAVENOUS

## 2021-06-07 MED ORDER — CELECOXIB 200 MG PO CAPS
ORAL_CAPSULE | ORAL | Status: AC
  Filled 2021-06-07: qty 2

## 2021-06-07 MED ORDER — ACETAMINOPHEN 500 MG PO TABS
ORAL_TABLET | ORAL | Status: AC
  Filled 2021-06-07: qty 2

## 2021-06-07 MED ORDER — PRAZOSIN HCL 1 MG PO CAPS
1.0000 mg | ORAL_CAPSULE | Freq: Every day | ORAL | Status: DC
  Filled 2021-06-07: qty 1

## 2021-06-07 SURGICAL SUPPLY — 111 items
ADH SKN CLS APL DERMABOND .7 (GAUZE/BANDAGES/DRESSINGS) ×8
ALLOGRAFT PERF 16X20 1.6+/-0.4 (Tissue) ×2 IMPLANT
APL PRP STRL LF DISP 70% ISPRP (MISCELLANEOUS) ×4
APL SKNCLS STERI-STRIP NONHPOA (GAUZE/BANDAGES/DRESSINGS)
APPLIER CLIP 11 MED OPEN (CLIP) ×3
APPLIER CLIP 9.375 MED OPEN (MISCELLANEOUS)
APR CLP MED 11 20 MLT OPN (CLIP) ×2
APR CLP MED 9.3 20 MLT OPN (MISCELLANEOUS)
BAG DECANTER FOR FLEXI CONT (MISCELLANEOUS) ×3 IMPLANT
BENZOIN TINCTURE PRP APPL 2/3 (GAUZE/BANDAGES/DRESSINGS) IMPLANT
BINDER BREAST LRG (GAUZE/BANDAGES/DRESSINGS) ×1 IMPLANT
BINDER BREAST MEDIUM (GAUZE/BANDAGES/DRESSINGS) IMPLANT
BINDER BREAST XLRG (GAUZE/BANDAGES/DRESSINGS) IMPLANT
BINDER BREAST XXLRG (GAUZE/BANDAGES/DRESSINGS) IMPLANT
BIOPATCH RED 1 DISK 7.0 (GAUZE/BANDAGES/DRESSINGS) IMPLANT
BLADE CLIPPER SURG (BLADE) IMPLANT
BLADE HEX COATED 2.75 (ELECTRODE) ×3 IMPLANT
BLADE SURG 10 STRL SS (BLADE) ×4 IMPLANT
BLADE SURG 15 STRL LF DISP TIS (BLADE) ×2 IMPLANT
BLADE SURG 15 STRL SS (BLADE) ×3
BNDG GAUZE ELAST 4 BULKY (GAUZE/BANDAGES/DRESSINGS) ×2 IMPLANT
CANISTER SUCT 1200ML W/VALVE (MISCELLANEOUS) ×3 IMPLANT
CHLORAPREP W/TINT 26 (MISCELLANEOUS) ×6 IMPLANT
CLIP APPLIE 11 MED OPEN (CLIP) ×2 IMPLANT
CLIP APPLIE 9.375 MED OPEN (MISCELLANEOUS) IMPLANT
CLIP TI WIDE RED SMALL 6 (CLIP) IMPLANT
COVER BACK TABLE 60X90IN (DRAPES) ×3 IMPLANT
COVER MAYO STAND STRL (DRAPES) ×4 IMPLANT
COVER PROBE W GEL 5X96 (DRAPES) ×3 IMPLANT
DECANTER SPIKE VIAL GLASS SM (MISCELLANEOUS) IMPLANT
DERMABOND ADVANCED (GAUZE/BANDAGES/DRESSINGS) ×4
DERMABOND ADVANCED .7 DNX12 (GAUZE/BANDAGES/DRESSINGS) ×4 IMPLANT
DRAIN CHANNEL 15F RND FF W/TCR (WOUND CARE) ×2 IMPLANT
DRAIN CHANNEL 19F RND (DRAIN) ×2 IMPLANT
DRAPE INCISE IOBAN 66X45 STRL (DRAPES) ×1 IMPLANT
DRAPE TOP ARMCOVERS (MISCELLANEOUS) ×4 IMPLANT
DRAPE U-SHAPE 76X120 STRL (DRAPES) ×4 IMPLANT
DRAPE UTILITY XL STRL (DRAPES) ×4 IMPLANT
DRSG PAD ABDOMINAL 8X10 ST (GAUZE/BANDAGES/DRESSINGS) ×8 IMPLANT
DRSG TEGADERM 2-3/8X2-3/4 SM (GAUZE/BANDAGES/DRESSINGS) IMPLANT
DRSG TEGADERM 4X10 (GAUZE/BANDAGES/DRESSINGS) ×3 IMPLANT
DRSG TEGADERM 4X4.75 (GAUZE/BANDAGES/DRESSINGS) IMPLANT
ELECT BLADE 4.0 EZ CLEAN MEGAD (MISCELLANEOUS) ×3
ELECT COATED BLADE 2.86 ST (ELECTRODE) ×2 IMPLANT
ELECT REM PT RETURN 9FT ADLT (ELECTROSURGICAL) ×3
ELECTRODE BLDE 4.0 EZ CLN MEGD (MISCELLANEOUS) IMPLANT
ELECTRODE REM PT RTRN 9FT ADLT (ELECTROSURGICAL) ×2 IMPLANT
EVACUATOR SILICONE 100CC (DRAIN) ×4 IMPLANT
EXPANDER TISSUE FORTE 300CC (Breast) IMPLANT
GAUZE SPONGE 4X4 12PLY STRL (GAUZE/BANDAGES/DRESSINGS) ×3 IMPLANT
GAUZE SPONGE 4X4 12PLY STRL LF (GAUZE/BANDAGES/DRESSINGS) IMPLANT
GLOVE SURG ENC MOIS LTX SZ6 (GLOVE) IMPLANT
GLOVE SURG ENC MOIS LTX SZ7 (GLOVE) ×4 IMPLANT
GLOVE SURG HYDRASOFT LTX SZ5.5 (GLOVE) ×3 IMPLANT
GLOVE SURG UNDER POLY LF SZ7.5 (GLOVE) ×3 IMPLANT
GOWN STRL REUS W/ TWL LRG LVL3 (GOWN DISPOSABLE) ×6 IMPLANT
GOWN STRL REUS W/TWL LRG LVL3 (GOWN DISPOSABLE) ×9
HEMOSTAT ARISTA ABSORB 3G PWDR (HEMOSTASIS) ×1 IMPLANT
ILLUMINATOR WAVEGUIDE N/F (MISCELLANEOUS) IMPLANT
IV NS 500ML (IV SOLUTION)
IV NS 500ML BAXH (IV SOLUTION) IMPLANT
KIT FILL SYSTEM UNIVERSAL (SET/KITS/TRAYS/PACK) ×3 IMPLANT
KIT MARKER MARGIN INK (KITS) IMPLANT
LIGHT WAVEGUIDE WIDE FLAT (MISCELLANEOUS) ×1 IMPLANT
MARKER SKIN DUAL TIP RULER LAB (MISCELLANEOUS) IMPLANT
NDL HYPO 25X1 1.5 SAFETY (NEEDLE) IMPLANT
NDL SAFETY ECLIPSE 18X1.5 (NEEDLE) ×2 IMPLANT
NEEDLE HYPO 18GX1.5 SHARP (NEEDLE) ×3
NEEDLE HYPO 25X1 1.5 SAFETY (NEEDLE) ×3 IMPLANT
NS IRRIG 1000ML POUR BTL (IV SOLUTION) ×3 IMPLANT
PACK BASIN DAY SURGERY FS (CUSTOM PROCEDURE TRAY) ×3 IMPLANT
PENCIL SMOKE EVACUATOR (MISCELLANEOUS) ×3 IMPLANT
PIN SAFETY STERILE (MISCELLANEOUS) ×3 IMPLANT
PUNCH BIOPSY DERMAL 4MM (MISCELLANEOUS) IMPLANT
RETRACTOR ONETRAX LX 90X20 (MISCELLANEOUS) IMPLANT
SHEET MEDIUM DRAPE 40X70 STRL (DRAPES) ×3 IMPLANT
SLEEVE SCD COMPRESS KNEE MED (STOCKING) ×3 IMPLANT
SPONGE T-LAP 18X18 ~~LOC~~+RFID (SPONGE) ×8 IMPLANT
SPONGE T-LAP 4X18 ~~LOC~~+RFID (SPONGE) IMPLANT
STAPLER VISISTAT 35W (STAPLE) ×3 IMPLANT
STRIP CLOSURE SKIN 1/2X4 (GAUZE/BANDAGES/DRESSINGS) IMPLANT
SUT CHROMIC 4 0 PS 2 18 (SUTURE) ×4 IMPLANT
SUT ETHILON 2 0 FS 18 (SUTURE) ×1 IMPLANT
SUT ETHILON 3 0 PS 1 (SUTURE) IMPLANT
SUT MNCRL AB 3-0 PS2 18 (SUTURE) IMPLANT
SUT MNCRL AB 4-0 PS2 18 (SUTURE) ×2 IMPLANT
SUT PDS AB 2-0 CT2 27 (SUTURE) IMPLANT
SUT SILK 2 0 SH (SUTURE) ×1 IMPLANT
SUT VIC AB 2-0 SH 27 (SUTURE) ×6
SUT VIC AB 2-0 SH 27XBRD (SUTURE) IMPLANT
SUT VIC AB 3-0 54X BRD REEL (SUTURE) IMPLANT
SUT VIC AB 3-0 BRD 54 (SUTURE)
SUT VIC AB 3-0 SH 27 (SUTURE) ×3
SUT VIC AB 3-0 SH 27X BRD (SUTURE) IMPLANT
SUT VICRYL 0 CT-2 (SUTURE) ×4 IMPLANT
SUT VICRYL 3-0 CR8 SH (SUTURE) ×1 IMPLANT
SUT VICRYL 4-0 PS2 18IN ABS (SUTURE) ×2 IMPLANT
SUT VLOC 180 0 24IN GS25 (SUTURE) ×2 IMPLANT
SYR 10ML LL (SYRINGE) IMPLANT
SYR 50ML LL SCALE MARK (SYRINGE) ×3 IMPLANT
SYR BULB IRRIG 60ML STRL (SYRINGE) ×3 IMPLANT
SYR CONTROL 10ML LL (SYRINGE) IMPLANT
TAPE MEASURE VINYL STERILE (MISCELLANEOUS) IMPLANT
TISSUE EXPANDER FORTE 300CC (Breast) ×6 IMPLANT
TOWEL GREEN STERILE FF (TOWEL DISPOSABLE) ×6 IMPLANT
TRACER MAGTRACE VIAL (MISCELLANEOUS) ×2 IMPLANT
TRAY DSU PREP LF (CUSTOM PROCEDURE TRAY) ×1 IMPLANT
TRAY FOLEY W/BAG SLVR 14FR LF (SET/KITS/TRAYS/PACK) ×1 IMPLANT
TUBE CONNECTING 20X1/4 (TUBING) ×3 IMPLANT
UNDERPAD 30X36 HEAVY ABSORB (UNDERPADS AND DIAPERS) ×6 IMPLANT
YANKAUER SUCT BULB TIP NO VENT (SUCTIONS) ×3 IMPLANT

## 2021-06-07 NOTE — Anesthesia Procedure Notes (Signed)
Procedure Name: Intubation Date/Time: 06/07/2021 7:40 AM Performed by: Glory Buff, CRNA Pre-anesthesia Checklist: Patient identified, Emergency Drugs available, Suction available and Patient being monitored Patient Re-evaluated:Patient Re-evaluated prior to induction Oxygen Delivery Method: Circle system utilized Preoxygenation: Pre-oxygenation with 100% oxygen Induction Type: IV induction Ventilation: Mask ventilation without difficulty Laryngoscope Size: Miller and 3 Grade View: Grade I Tube type: Oral Tube size: 7.0 mm Number of attempts: 1 Airway Equipment and Method: Stylet and Oral airway Placement Confirmation: ETT inserted through vocal cords under direct vision, positive ETCO2 and breath sounds checked- equal and bilateral Secured at: 20 cm Tube secured with: Tape Dental Injury: Teeth and Oropharynx as per pre-operative assessment

## 2021-06-07 NOTE — Transfer of Care (Signed)
Immediate Anesthesia Transfer of Care Note  Patient: Sierra Newman  Procedure(s) Performed: LEFT NIPPLE SPARING MASTECTOMY WITH BILATERAL AXILLARY SENTINEL LYMPH NODE BIOPSY (Bilateral: Breast) RIGHT NIPPLE SPARING MASTECTOMY (Right: Breast) BREAST RECONSTRUCTION WITH PLACEMENT OF TISSUE EXPANDER AND ALLODERM (Bilateral: Breast)  Patient Location: PACU  Anesthesia Type:General  Level of Consciousness: drowsy and patient cooperative  Airway & Oxygen Therapy: Patient Spontanous Breathing and Patient connected to face mask oxygen  Post-op Assessment: Report given to RN and Post -op Vital signs reviewed and stable  Post vital signs: Reviewed and stable  Last Vitals:  Vitals Value Taken Time  BP 146/81 06/07/21 1134  Temp    Pulse 86 06/07/21 1134  Resp 18 06/07/21 1134  SpO2 100 % 06/07/21 1134  Vitals shown include unvalidated device data.  Last Pain:  Vitals:   06/07/21 0641  TempSrc: Oral  PainSc: 0-No pain      Patients Stated Pain Goal: 3 (35/45/[id number])  Complications: No notable events documented.

## 2021-06-07 NOTE — Op Note (Signed)
Preoperative diagnosis: Bilateral clinical stage I breast cancer Postoperative diagnosis: Same as above Procedure: 1.  Right nipple sparing mastectomy 2.  Injection of mag trace, right for sentinel lymph node identification 3.  Right deep axillary sentinel lymph node biopsy 4   left nipple sparing mastectomy 5.  Injection of mag trace, left for sentinel lymph node identification 6.  Left deep axillary sentinel lymph node biopsy Surgeon: Dr. Serita Grammes Anesthesia: General with bilateral pectoral blocks Estimated blood loss: 75 cc Drains: Per plastic surgery Specimens: 1.  Right breast tissue marked short superior, long lateral, double nipple and areola 2.  Right nipple biopsy 3.  Right deep axillary sentinel lymph nodes with highest count of 4200 4.  Left breast tissue marked short superior, long lateral, double nipple and areola 5.  Left nipple biopsy 6.  Left deep axillary sentinel nodes with highest count of 6578 Complications: None Disposition Case turned over to plastic surgery for reconstruction  Indications: This a 51 year old female underwent a screening mammogram.  She has D density breast.  She has a 3.4 cm architectural distortion in the left breast upper outer quadrant.  On ultrasound there is a 2.5 cm mass with probable intraductal extension.  Nodes appear to be normal.  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  She had underwent an MRI with a normal mammogram on the E right side that showed a 1.1 cm mass in the far lower outer quadrant posteriorly.  There was no adenopathy.  Biopsy of the right side also showed a grade 1-2 invasive ductal carcinoma that was ER/PR positive.  We elected to proceed with a bilateral nipple sparing mastectomy, bilateral sentinel node biopsy, expander reconstruction.  Procedure: After informed consent was obtained I first injected 2 cc of mag trace in the subareolar position after prepping  near the areola on both sides.  She then underwent bilateral pectoral blocks she was given antibiotics.  SCDs were in place.  She was placed under general anesthesia without complication.  She was prepped and draped in a standard sterile surgical fashion.  A surgical timeout was then performed.  I did the left mastectomy first.  I made an inframammary incision that started 8 cm from the xiphoid.  I extended this 10 cm laterally in the inframammary fold.  I then developed the posterior plane to include the fascia off of the muscle with a combination of cautery and blunt dissection.  I did this to the parasternal area, clavicle, the lateral border of the breast.  I then developed the anterior plane with a combination of cautery as well as the facelift scissors.  I then remove the breast tissue in its entirety.  I marked it as above.  I remove the entire left nipple and sent this as a separate specimen.  I did trim the flaps a little bit and sent this tissue with the breast.  Hemostasis was then observed.  I then made a separate incision in the left axilla.  I carried this through the axillary fascia.  I used the sentimag probe to identify what appeared to be a couple sentinel lymph nodes.  I passed these off the table.  There is no background activity.  There were no other palpable nodes.  I obtained hemostasis.  I closed this with 2-0 Vicryl, 3-0 Vicryl, and 4-0 Monocryl.  Glue and Steri-Strips were applied.  I then turned my attention to the right side. made an inframammary incision  that started 8 cm from the xiphoid.  I extended this 10 cm laterally in the inframammary fold.  I then developed the posterior plane to include the fascia off of the muscle with a combination of cautery and blunt dissection.  I did this to the parasternal area, clavicle, the lateral border of the breast.  I then developed the anterior plane with a combination of cautery as well as the facelift scissors.  I then remove the breast  tissue in its entirety.  I marked it as above.  I remove the entire left nipple and sent this as a separate specimen.  I did trim the flaps a little bit and sent this tissue with the breast.  Hemostasis was then observed.  I then made a separate incision in the left axilla.  I carried this through the axillary fascia.  I used the sentimag probe to identify what appeared to be a couple sentinel lymph nodes.  I passed these off the table.  There is no background activity.  There were no other palpable nodes.  I obtained hemostasis.  I closed this with 2-0 Vicryl, 3-0 Vicryl, and 4-0 Monocryl.  Glue and Steri-Strips were applied. The case was then turned over to plastic surgery for reconstruction.

## 2021-06-07 NOTE — Interval H&P Note (Signed)
History and Physical Interval Note:  06/07/2021 6:58 AM  Sierra Newman  has presented today for surgery, with the diagnosis of BREAST CANCER.  The various methods of treatment have been discussed with the patient and family. After consideration of risks, benefits and other options for treatment, the patient has consented to  Procedure(s): LEFT NIPPLE SPARING MASTECTOMY WITH BILATERAL AXILLARY SENTINEL LYMPH NODE BIOPSY (Bilateral) RIGHT NIPPLE SPARING MASTECTOMY (Right) BREAST RECONSTRUCTION WITH PLACEMENT OF TISSUE EXPANDER AND ALLODERM (Bilateral) as a surgical intervention.  The patient's history has been reviewed, patient examined, no change in status, stable for surgery.  I have reviewed the patient's chart and labs.  Questions were answered to the patient's satisfaction.     Arnoldo Hooker Mervil Wacker

## 2021-06-07 NOTE — Anesthesia Postprocedure Evaluation (Signed)
Anesthesia Post Note  Patient: Sierra Newman  Procedure(s) Performed: LEFT NIPPLE SPARING MASTECTOMY WITH BILATERAL AXILLARY SENTINEL LYMPH NODE BIOPSY (Bilateral: Breast) RIGHT NIPPLE SPARING MASTECTOMY (Right: Breast) BREAST RECONSTRUCTION WITH PLACEMENT OF TISSUE EXPANDER AND ALLODERM (Bilateral: Breast)     Patient location during evaluation: PACU Anesthesia Type: Regional and General Level of consciousness: awake and alert Pain management: pain level controlled Vital Signs Assessment: post-procedure vital signs reviewed and stable Respiratory status: spontaneous breathing, nonlabored ventilation, respiratory function stable and patient connected to nasal cannula oxygen Cardiovascular status: blood pressure returned to baseline and stable Postop Assessment: no apparent nausea or vomiting Anesthetic complications: no   No notable events documented.  Last Vitals:  Vitals:   06/07/21 1300 06/07/21 1315  BP: 128/74 131/78  Pulse: 83 86  Resp: 10 13  Temp:    SpO2: 96% 96%    Last Pain:  Vitals:   06/07/21 1315  TempSrc:   PainSc: 6                  Zosia Lucchese

## 2021-06-07 NOTE — Interval H&P Note (Signed)
History and Physical Interval Note:  06/07/2021 6:56 AM  Sierra Newman  has presented today for surgery, with the diagnosis of BREAST CANCER.  The various methods of treatment have been discussed with the patient and family. After consideration of risks, benefits and other options for treatment, the patient has consented to  Procedure(s): LEFT NIPPLE SPARING MASTECTOMY WITH BILATERAL AXILLARY SENTINEL LYMPH NODE BIOPSY (Bilateral) RIGHT NIPPLE SPARING MASTECTOMY (Right) BREAST RECONSTRUCTION WITH PLACEMENT OF TISSUE EXPANDER AND ALLODERM (Bilateral) as a surgical intervention.  The patient's history has been reviewed, patient examined, no change in status, stable for surgery.  I have reviewed the patient's chart and labs.  Questions were answered to the patient's satisfaction.     Rolm Bookbinder

## 2021-06-07 NOTE — Progress Notes (Signed)
Assisted Dr. Oddono with right, left, ultrasound guided, pectoralis block. Side rails up, monitors on throughout procedure. See vital signs in flow sheet. Tolerated Procedure well. 

## 2021-06-07 NOTE — Anesthesia Procedure Notes (Signed)
Anesthesia Regional Block: Pectoralis block   Pre-Anesthetic Checklist: , timeout performed,  Correct Patient, Correct Site, Correct Laterality,  Correct Procedure, Correct Position, site marked,  Risks and benefits discussed,  Surgical consent,  Pre-op evaluation,  At surgeon's request and post-op pain management  Laterality: Left and Right  Prep: chloraprep       Needles:  Injection technique: Single-shot  Needle Type: Echogenic Stimulator Needle     Needle Length: 5cm  Needle Gauge: 22     Additional Needles:   Procedures:, nerve stimulator,,, ultrasound used (permanent image in chart),,    Narrative:  Start time: 06/07/2021 7:20 AM End time: 06/07/2021 7:28 AM Injection made incrementally with aspirations every 5 mL.  Performed by: Personally  Anesthesiologist: Janeece Riggers, MD  Additional Notes: Functioning IV was confirmed and monitors were applied.  A 84mm 22ga Arrow echogenic stimulator needle was used. Sterile prep and drape,hand hygiene and sterile gloves were used. Ultrasound guidance: relevant anatomy identified, needle position confirmed, local anesthetic spread visualized around nerve(s)., vascular puncture avoided.  Image printed for medical record. Negative aspiration and negative test dose prior to incremental administration of local anesthetic. The patient tolerated the procedure well.

## 2021-06-07 NOTE — Op Note (Signed)
Operative Note   DATE OF OPERATION: 1.31.23  LOCATION: [facility]-observation  SURGICAL DIVISION: Plastic Surgery  PREOPERATIVE DIAGNOSES: 1. Left breast cancer UOQ ER+ 2. Right breast cancer LOQ ER+  POSTOPERATIVE DIAGNOSES:  same  PROCEDURE:  1. Bilateral breast reconstruction with tissue expanders 2. Acellular dermis (Alloderm) 600 cm2 to bilateral chest  SURGEON: Irene Limbo MD MBA  ASSISTANT: none  ANESTHESIA:  General.   EBL: 100 ml for entire procedure  COMPLICATIONS: None immediate.   INDICATIONS FOR PROCEDURE:  The patient, Sierra Newman, is a 51 y.o. female born on Jun 05, 1970, is here for immediate prepectoral expander based reconstruction following nipple sparing mastectomies.    FINDINGS: Natrelle 133S FV-11-T 300 ml tissue expanders placed bilateral. Initial fill volume 180 ml air bilateral. RIGHT SN [id number] LEFT SN [id number]  DESCRIPTION OF PROCEDURE:  The patient was marked with the patient in the preoperative area to mark sternal notch, chest midline, anterior axillary lines and inframammary folds. Following induction, Foley catheter placed. The patient's operative site was prepped and draped in a sterile fashion. A time out was performed and all information was confirmed to be correct. I assisted in mastectomies with exposure and retraction. Following completion of mastectomies, reconstruction began on the left side. Hemostasis ensured. Cavity irrigated with saline solution containing Ancef, gentamicin, and Betadine. A 19 Fr drain was placed in subcutaneous position laterally and a 15 Fr drain placed along inframammary fold. Each secured to skin with 2-0 nylon. The tissue expanders were prepared on back table prior in insertion. The expander was filled with air. Perforated acellular dermis was draped over anterior surface expander. The ADM was then secured to itself over posterior surface of expander with 4-0 chromic. Redundant folds acellular dermis excised  so that the ADM lay flat without folds over air filled expander. The expander was secured to fascia over lateral sternal border with a 0 vicryl. The lateral tab was also secured to pectoralis muscle with 0-vicryl. The ADM was secured to pectoralis muscle and chest wall along inferior border at inframammary fold with 0 V-lock suture. Laterally the mastectomy flap over posterior axillary line was advanced anteriorly and the subcutaneous tissue and superficial fascia was secured to pectoralis and serratus muscles with 0-vicryl. Skin closure completed with 3-0 vicryl in fascial layer and 4-0 vicryl in dermis. Skin closure completed with 4-0 monocryl subcuticular and tissue adhesive.   I then directed my attention to right chest where similar irrigation and drain placement completed. The prepared expander with ADM secured over anterior surface was placed in left chest and tabs secured to chest wall and pectoralis muscle with 0- vicryl suture. The acellular dermis at inframammary fold was secured to chest wall with 0 V-lock suture. Laterally the mastectomy flap over posterior axillary line was advanced anteriorly and the subcutaneous tissue and superficial fascia was secured to pectoralis and serratus muscles with 0-vicryl. Skin closure completed with 3-0 vicryl in fascial layer and 4-0 vicryl in dermis. Skin closure completed with 4-0 monocryl subcuticular. The mastectomy flaps were redraped so that NAC was symmetric from sternal notch and chest midline. Tissue adhesive applied over all incisions. Tegaderm dressings applied followed by dry dressing, breast binder.  The patient was allowed to wake from anesthesia, extubated and taken to the recovery room in satisfactory condition.   SPECIMENS: none  DRAINS: 15 Fr and 19 Fr JP in right and left reconstruction

## 2021-06-07 NOTE — Discharge Instructions (Signed)
Information for Discharge Teaching: EXPAREL (bupivacaine liposome injectable suspension)   Your surgeon or anesthesiologist gave you EXPAREL(bupivacaine) to help control your pain after surgery.  EXPAREL is a local anesthetic that provides pain relief by numbing the tissue around the surgical site. EXPAREL is designed to release pain medication over time and can control pain for up to 72 hours. Depending on how you respond to EXPAREL, you may require less pain medication during your recovery.  Possible side effects: Temporary loss of sensation or ability to move in the area where bupivacaine was injected. Nausea, vomiting, constipation Rarely, numbness and tingling in your mouth or lips, lightheadedness, or anxiety may occur. Call your doctor right away if you think you may be experiencing any of these sensations, or if you have other questions regarding possible side effects.  Follow all other discharge instructions given to you by your surgeon or nurse. Eat a healthy diet and drink plenty of water or other fluids.  If you return to the hospital for any reason within 96 hours following the administration of EXPAREL, it is important for health care providers to know that you have received this anesthetic. A teal colored band has been placed on your arm with the date, time and amount of EXPAREL you have received in order to alert and inform your health care providers. Please leave this armband in place for the full 96 hours following administration, and then you may remove the band.

## 2021-06-07 NOTE — H&P (Signed)
50 yof who know her from a prior right sided radial scar excision in 2017. She is otherwise fairly healthy. She noted a left breast mass on her self-exam. She underwent a mammogram that shows D density breast tissue. There is an amorphous indistinct calcification in the right breast. There is a 3.4 cm architectural distortion in the left breast upper outer quadrant. This was followed up by an ultrasound. There is a 2.5 cm mass with probable intraductal extension in the left breast. Her nodes appear to be normal. 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 She has no discharge. She then underwent MRI that shows a 3x2x1.8 cm upper left breast mass. Right has a 1.1 cm mass in far loq. No adenopathy. She had biopsy of the right side now that shows a grade 1-2 IDC that is 100% er pos, 60% pr pos, her 2 negative and Ki is 1%. She is here to discuss options ° ° °Medical History: °Past Medical History:  °Diagnosis Date  ° Anxiety  ° History of cancer  ° °Past Surgical History:  °Procedure Laterality Date  ° BREAST EXCISIONAL BIOPSY Right  °radial scar  ° CATARACT EXTRACTION  ° CRANIOTOMY EXPLORATORY  ° °No Known Allergies ° °Current Outpatient Medications on File Prior to Visit  °Medication Sig Dispense Refill  ° ALPRAZolam (XANAX) 0.25 MG tablet TAKE 1 TABLET BY MOUTH EVERY 6 TO 8 HOURS AS NEEDED FOR ANXIETY ATTACKS  ° fluticasone propionate (FLONASE) 50 mcg/actuation nasal spray Place 2 sprays into both nostrils once daily  ° gabapentin (NEURONTIN) 100 MG capsule TAKE 1 CAPSULE BY MOUTH TWICE DAILY AS NEEDED FOR ANXIETY OR INSOMNIA  ° L-LYSINE ORAL Take by mouth  ° lamoTRIgine (LAMICTAL) 150 MG tablet lamotrigine 150 mg tablet  ° melatonin 5 mg Tab Take by mouth  ° prazosin (MINIPRESS) 1 MG capsule prazosin 1 mg capsule  ° sertraline (ZOLOFT) 50 MG tablet Take 1 tablet (50 mg total) by  mouth every morning  ° °Family History  °Problem Relation Age of Onset  ° Skin cancer Father  ° High blood pressure (Hypertension) Father  ° Diabetes Father  ° ° °Social History  ° °Tobacco Use  °Smoking Status Never  °Smokeless Tobacco Never  ° ° °Social History  ° °Socioeconomic History  ° Marital status: Married  °Tobacco Use  ° Smoking status: Never  ° Smokeless tobacco: Never  °Vaping Use  ° Vaping Use: Never used  °Substance and Sexual Activity  ° Alcohol use: Yes  °Comment: 1 per month  ° Drug use: Never  ° °Objective:  ° °Physical Exam °Chest:  °Breasts: °Right: Mass (hematoma far posterior loq) present. No other mass ° No lad °Cv regular  °Pulm effort normal ° ° °Assessment and Plan:  ° ° °Bilateral malignant neoplasm of breast in female, estrogen receptor positive, unspecified site of breast (CMS-HCC) ° ° °Bilateral nipple sparing mastectomy and bilateral axillary sentinel lymph node biopsy ° °We had already planned a left nipple sparing with reconstruction and a left axillary sentinel node biopsy. She had desired a risk reducing mastectomy on the right side. Now she has cancer on that side I think it is amenable to a nipple sparing mastectomy and we discussed adding a sentinel lymph node biopsy to that side.  °

## 2021-06-08 ENCOUNTER — Encounter (HOSPITAL_BASED_OUTPATIENT_CLINIC_OR_DEPARTMENT_OTHER): Payer: Self-pay | Admitting: General Surgery

## 2021-06-08 NOTE — Progress Notes (Signed)
Patient ID: Sierra Newman, female   DOB: [birth year], 51 y.o.   MRN: [id number] Doing well, drains as expected, discussed surgery with her and will call with pathology when available

## 2021-06-08 NOTE — Discharge Summary (Signed)
Physician Discharge Summary  Patient ID: Sierra Newman MRN: [id number] DOB/AGE: [birth year] 51 y.o.  Admit date: 06/07/2021 Discharge date: 06/08/2021  Admission Diagnoses: Bilateral breast cancer  Discharge Diagnoses:  same  Discharged Condition: stable  Hospital Course: Post operatively patient did well tolerating diet and oral pain medication. She was instructed on bathing and drain care.  Treatments: surgery: bilateral nipple sparing mastectomies sentinel node biopsies bilateral breast reconstruction with tissue expander acellular dermis 1.31.23  Discharge Exam: Blood pressure (!) 147/86, pulse 77, temperature 98.8 F (37.1 C), resp. rate 18, height 5' 3.5" (1.613 m), weight 60.1 kg, SpO2 99 %. Incision/Wound: incisions intact dry Tegaderms in place chest soft skin flaps viable drains serosanguinous  Disposition: Discharge disposition: 01-Home or Self Care       Discharge Instructions     Call MD for:  redness, tenderness, or signs of infection (pain, swelling, bleeding, redness, odor or green/yellow discharge around incision site)   Complete by: As directed    Call MD for:  severe or increased pain, loss or decreased feeling  in affected limb(s)   Complete by: As directed    Call MD for:  temperature >100.5   Complete by: As directed    Discharge instructions   Complete by: As directed    Ok to remove dressings and shower am 2.2.23 Soap and water ok, pat Tegaderms dry. Do not remove Tegaderms. No creams or ointments over incisions. Do not let drains dangle in shower, attach to lanyard or similar.Strip and record drains twice daily and bring log to clinic visit.  Breast binder or soft compression bra all other times.  Ok to raise arms above shoulders for bathing and dressing.  No house yard work or exercise until cleared by MD.   Recommend ibuprofen with meals to aid with pain control. Also ok to use tylenol for pain. Recommend Miralax or Dulcolax as needed for  constipation. Patient received all Rx preop.   Driving Restrictions   Complete by: As directed    No driving for 2 weeks then no driving if taking prescription pain medication   Lifting restrictions   Complete by: As directed    No lifting > 5-10 lbs until cleared by MD   Resume previous diet   Complete by: As directed       Allergies as of 06/08/2021   No Known Allergies      Medication List     TAKE these medications    acetaminophen 500 MG tablet Commonly known as: TYLENOL Take 1,000 mg by mouth every 6 (six) hours as needed for mild pain.   ALPRAZolam 0.25 MG tablet Commonly known as: XANAX Take 0.25 mg by mouth at bedtime as needed for anxiety.   fluticasone 50 MCG/ACT nasal spray Commonly known as: FLONASE Place 1 spray into both nostrils daily.   gabapentin 100 MG capsule Commonly known as: NEURONTIN Take 100 mg by mouth at bedtime.   L-Lysine 1000 MG Tabs Take by mouth.   lamoTRIgine 150 MG tablet Commonly known as: LAMICTAL Take 150 mg by mouth daily.   melatonin 5 MG Tabs Take 5 mg by mouth at bedtime.   prazosin 1 MG capsule Commonly known as: MINIPRESS Take 1 mg by mouth at bedtime.   sertraline 50 MG tablet Commonly known as: ZOLOFT Take 50 mg by mouth daily.        Follow-up Information     Irene Limbo, MD Follow up in 1 week(s).   Specialty: Plastic Surgery Why: as  scheduled Contact information: [address] [place] [address] [phone]         Rolm Bookbinder, MD. Schedule an appointment as soon as possible for a visit in 2 week(s).   Specialty: General Surgery Contact information: [address] [place] [address] [phone]                 Signed: Irene Limbo 06/08/2021, 7:46 AM

## 2021-06-08 NOTE — Addendum Note (Signed)
Addendum  created 06/08/21 1013 by Maryella Shivers, CRNA   Charge Capture section accepted

## 2021-06-09 LAB — SURGICAL PATHOLOGY

## 2021-06-10 ENCOUNTER — Encounter: Payer: Self-pay | Admitting: *Deleted

## 2021-06-10 ENCOUNTER — Telehealth: Payer: Self-pay | Admitting: *Deleted

## 2021-06-10 NOTE — Telephone Encounter (Signed)
Ordered mammaprint on left breast mastectomy per Dr. Lindi Adie. Faxed requisition to pathology.

## 2021-06-14 ENCOUNTER — Ambulatory Visit: Payer: No Typology Code available for payment source | Admitting: Hematology and Oncology

## 2021-06-14 ENCOUNTER — Other Ambulatory Visit: Payer: Self-pay | Admitting: *Deleted

## 2021-06-14 ENCOUNTER — Telehealth: Payer: Self-pay | Admitting: *Deleted

## 2021-06-14 ENCOUNTER — Encounter: Payer: Self-pay | Admitting: *Deleted

## 2021-06-14 NOTE — Telephone Encounter (Signed)
Received call from patient's husband asking to r/s app with Dr. Lindi Adie to after we have results of mammaprint.  R/s appt to 2/16 at 1015am.

## 2021-06-17 ENCOUNTER — Inpatient Hospital Stay
Admission: RE | Admit: 2021-06-17 | Discharge: 2021-06-17 | Disposition: A | Discharge status: Home / Self Care | Payer: Self-pay | Source: Ambulatory Visit | Attending: Radiation Oncology | Admitting: Radiation Oncology

## 2021-06-17 ENCOUNTER — Ambulatory Visit
Admission: RE | Admit: 2021-06-17 | Discharge: 2021-06-17 | Disposition: A | Discharge status: Home / Self Care | Payer: Self-pay | Source: Ambulatory Visit | Attending: Radiation Oncology | Admitting: Radiation Oncology

## 2021-06-17 ENCOUNTER — Ambulatory Visit: Payer: No Typology Code available for payment source | Admitting: Hematology and Oncology

## 2021-06-17 ENCOUNTER — Other Ambulatory Visit: Payer: Self-pay | Admitting: Radiation Oncology

## 2021-06-17 ENCOUNTER — Encounter: Payer: Self-pay | Admitting: *Deleted

## 2021-06-17 ENCOUNTER — Telehealth: Payer: Self-pay | Admitting: *Deleted

## 2021-06-17 DIAGNOSIS — Z170 Estrogen receptor positive status [ER+]: Secondary | ICD-10-CM

## 2021-06-17 DIAGNOSIS — C50412 Malignant neoplasm of upper-outer quadrant of left female breast: Secondary | ICD-10-CM

## 2021-06-17 NOTE — Telephone Encounter (Signed)
Received mammaprint results low risk. Patient is aware. Referral placed for rad onc.
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
## 2021-06-21 ENCOUNTER — Encounter (HOSPITAL_COMMUNITY): Payer: Self-pay

## 2021-06-21 NOTE — Progress Notes (Signed)
New Breast Cancer Diagnosis: Bilateral Breast  Did patient present with symptoms (if so, please note symptoms) or screening mammography?:Palpable mass noted in the left breast.  MRI Breast 04/25/2021: 3 cm in the left breast in the upper aspect.  No abnormalities were seen in her left or right axilla.  An enhancing lower outer quadrant lesion in the right breast also was seen measuring 1.1 cm.   Location and Extent of disease : Right breast mass measured 1.1 cm and was located at 8 o'clock position, showed grade 1-2 Invasive ductal carcinoma that was ER/PR Positive, HER2 was negative with Ki-67 of 1 %   Left Breast mass measured 3 cm and was located in the Upper outer quadrant, showed grade 1-2 Invasive Ductal Carcinoma that was ER/PR positive, HER2 was negative with a Ki-67 of 5%  Pathology Report: 06/07/2021   Plastic Surgeon plan, if any: Dr. Iran Planas -Will see this afternoon for possible drain removal.  -Tissue expanders in place.  Surgeon and surgical plan, if any:  Dr. Donne Hazel 06/21/2021 -Drained 120 cc from right axilla.  She will be seen in 3 months or sooner if needed. -Bilateral nipple sparing Mastectomies with bilateral axillary SLN biopsy 06/07/2021   Medical oncologist, treatment if any:   Dr. Lindi Adie 06/23/2021   Family History of Breast/Ovarian/Prostate Cancer:   Lymphedema issues, if any: She reports mild swelling at the incision sites.  She feels like fluid is starting to build back up under her right arm.  Denies any swelling to either arm.  She is wearing the compression tube top and plans to change over to compression bra after her drains are removed.   Pain issues, if any: Pain across chest wall.    SAFETY ISSUES: Prior radiation? No Pacemaker/ICD? No Possible current pregnancy? None Is the patient on methotrexate? No  Current Complaints / other details:   Genetics: negative  -Sleeping in a recliner.

## 2021-06-22 ENCOUNTER — Ambulatory Visit
Admission: RE | Admit: 2021-06-22 | Discharge: 2021-06-22 | Disposition: A | Discharge status: Home / Self Care | Payer: No Typology Code available for payment source | Source: Ambulatory Visit | Attending: Radiation Oncology | Admitting: Radiation Oncology

## 2021-06-22 ENCOUNTER — Other Ambulatory Visit: Payer: Self-pay

## 2021-06-22 VITALS — BP 140/92 | HR 81 | Temp 97.8°F | Ht 64.0 in | Wt 138.4 lb

## 2021-06-22 DIAGNOSIS — C50511 Malignant neoplasm of lower-outer quadrant of right female breast: Secondary | ICD-10-CM | POA: Insufficient documentation

## 2021-06-22 DIAGNOSIS — Z9013 Acquired absence of bilateral breasts and nipples: Secondary | ICD-10-CM | POA: Insufficient documentation

## 2021-06-22 DIAGNOSIS — C50412 Malignant neoplasm of upper-outer quadrant of left female breast: Secondary | ICD-10-CM | POA: Insufficient documentation

## 2021-06-22 DIAGNOSIS — Z79899 Other long term (current) drug therapy: Secondary | ICD-10-CM | POA: Insufficient documentation

## 2021-06-22 DIAGNOSIS — Z800 Family history of malignant neoplasm of digestive organs: Secondary | ICD-10-CM | POA: Insufficient documentation

## 2021-06-22 DIAGNOSIS — Z170 Estrogen receptor positive status [ER+]: Secondary | ICD-10-CM | POA: Insufficient documentation

## 2021-06-22 DIAGNOSIS — I10 Essential (primary) hypertension: Secondary | ICD-10-CM | POA: Insufficient documentation

## 2021-06-22 DIAGNOSIS — Z801 Family history of malignant neoplasm of trachea, bronchus and lung: Secondary | ICD-10-CM | POA: Insufficient documentation

## 2021-06-22 NOTE — Progress Notes (Signed)
° °Patient Care Team: °Morris, Megan, DO as PCP - General (Obstetrics and Gynecology) °Martini, Keisha N, RN as Oncology Nurse Navigator °Stuart, Dawn C, RN as Oncology Nurse Navigator °Gudena, Vinay, MD as Consulting Physician (Hematology and Oncology) ° °DIAGNOSIS:  °  ICD-10-CM   °1. Malignant neoplasm of upper-outer quadrant of left breast in female, estrogen receptor positive (HCC)  C50.412   ° Z17.0   °  ° ° °SUMMARY OF ONCOLOGIC HISTORY: °Oncology History  °Malignant neoplasm of upper-outer quadrant of left breast in female, estrogen receptor positive (H[facility])  °04/14/2021 Initial Diagnosis  ° Palpable lump in the left breast, ultrasound and biopsy revealed grade 1-2 IDC with DCIS ER 80%, PR 80%, HER2 2+ by IHC FISH negative, Ki-67 15% °  °04/25/2021 Breast MRI  ° 3.0 x 2.0 x 1.8 cm enhancing mass in the upper left breast consistent with the patient's biopsy-proven malignancy, and suspicious 1.1 cm mass in the far posterior lower outer quadrant of the right breast abutting the chest wall.  °  ° Genetic Testing  ° Negative genetic testing. No pathogenic variants identified on the [employer] CancerNext-Expanded+RNA panel. The report date is 05/13/2021. ° °The CancerNext-Expanded + RNAinsight gene panel offered by [facility] [employer] and includes sequencing and rearrangement analysis for the following 77 genes: IP, ALK, APC*, ATM*, AXIN2, BAP1, BARD1, BLM, BMPR1A, BRCA1*, BRCA2*, BRIP1*, CDC73, CDH1*,CDK4, CDKN1B, CDKN2A, CHEK2*, CTNNA1, DICER1, FANCC, FH, FLCN, GALNT12, KIF1B, LZTR1, MAX, MEN1, MET, MLH1*, MSH2*, MSH3, MSH6*, MUTYH*, NBN, NF1*, NF2, NTHL1, PALB2*, PHOX2B, PMS2*, POT1, PRKAR1A, PTCH1, PTEN*, RAD51C*, RAD51D*,RB1, RECQL, RET, SDHA, SDHAF2, SDHB, SDHC, SDHD, SMAD4, SMARCA4, SMARCB1, SMARCE1, STK11, SUFU, TMEM127, TP53*,TSC1, TSC2, VHL and XRCC2 (sequencing and deletion/duplication); EGFR, EGLN1, HOXB13, KIT, MITF, PDGFRA, POLD1 and POLE (sequencing only); EPCAM and GREM1 (deletion/duplication only). °   °06/07/2021 Cancer Staging  ° Staging form: Breast, AJCC 8th Edition °- Pathologic stage from 06/07/2021: Stage IA (pT1c, pN1a, cM0, G1, ER+, PR+, HER2-) - Signed by Causey, Lindsey Cornetto, NP on 06/09/2021 °Stage prefix: Initial diagnosis °Histologic grading system: 3 grade system ° °  °06/07/2021 Surgery  ° Bilateral mastectomies: °Left mastectomy: Grade 1 IDC with DCIS, 1.5 cm, margins negative, 1/3 lymph node positive ER 90%, PR 80%, HER2 equivocal by IHC FISH negative ratio 1.26, Ki-67 5% °Right mastectomy: Grade 1 IDC with DCIS 0.5 cm, 0/3 lymph nodes negative ER 100%, PR 60%, HER2 equivocal by IHC, FISH negative ratio 1.19, Ki-67 1% °  ° ° °CHIEF COMPLIANT: Follow-up of left breast cancer ° °INTERVAL HISTORY: Sierra Newman is a 50 y.o. with above-mentioned history of left breast cancer. Bilateral mastectomies on 06/07/2021 showed invasive ductal carcinoma with DCIS in the left and right breasts, 1 out of 3 left axillary lymph nodes positive for metastatic carcinoma, and 3 out of 3 right axillary lymph nodes negative for metastatic carcinoma. She presents to the clinic today for follow-up.  ° °ALLERGIES:  has No Known Allergies. ° °MEDICATIONS:  °Current Outpatient Medications  °Medication Sig Dispense Refill  ° acetaminophen (TYLENOL) 500 MG tablet Take 1,000 mg by mouth every 6 (six) hours as needed for mild pain.    ° ALPRAZolam (XANAX) 0.25 MG tablet Take 0.25 mg by mouth at bedtime as needed for anxiety.    ° fluticasone (FLONASE) 50 MCG/ACT nasal spray Place 1 spray into both nostrils daily.    ° gabapentin (NEURONTIN) 100 MG capsule Take 100 mg by mouth at bedtime.    ° L-Lysine 1000 MG TABS Take by mouth.    °   lamoTRIgine (LAMICTAL) 150 MG tablet Take 150 mg by mouth daily.    ° melatonin 5 MG TABS Take 5 mg by mouth at bedtime.    ° methocarbamol (ROBAXIN) 500 MG tablet Take by mouth.    ° oxyCODONE (OXY IR/ROXICODONE) 5 MG immediate release tablet Take by mouth.    ° prazosin (MINIPRESS) 1 MG capsule  Take 1 mg by mouth at bedtime.    ° sertraline (ZOLOFT) 50 MG tablet Take 50 mg by mouth daily.    ° °No current facility-administered medications for this visit.  ° ° °PHYSICAL EXAMINATION: °ECOG PERFORMANCE STATUS: 1 - Symptomatic but completely ambulatory ° °Vitals:  ° 06/23/21 1012  °BP: (!) 156/86  °Pulse: 80  °Resp: 18  °Temp: (!) 97.5 °F (36.4 °C)  °SpO2: 99%  ° °Filed Weights  ° 06/23/21 1012  °Weight: 138 lb 14.4 oz (63 kg)  ° °   ° °ASSESSMENT & PLAN:  °Malignant neoplasm of upper-outer quadrant of left breast in female, estrogen receptor positive ([facility]) °06/07/2021:Bilateral mastectomies: °Left mastectomy: Grade 1 IDC with DCIS, 1.5 cm, margins negative, 1/4 lymph node positive ER 90%, PR 80%, HER2 equivocal by IHC FISH negative ratio 1.26, Ki-67 5% °Right mastectomy: Grade 1 IDC with DCIS 0.5 cm, 0/3 lymph nodes negative ER 100%, PR 60%, HER2 equivocal by IHC, FISH negative ratio 1.19, Ki-67 1% °MammaPrint: Low risk ° °Pathology counseling: I discussed the final pathology report of the patient provided  a copy of this report. I discussed the margins as well as lymph node surgeries. We also discussed the final staging along with previously performed ER/PR and HER-2/neu testing. ° °Treatment plan: °1. Adjuvant radiation therapy followed by °2. Adjuvant antiestrogen therapy: Because we are not sure whether she is currently menopausal we will obtain FSH and estradiol levels today. ° °Does not qualify for Verzenio because of low Ki-67. °Return to clinic after radiation is complete ° ° ° °No orders of the defined types were placed in this encounter. ° °The patient has a good understanding of the overall plan. she agrees with it. she will call with any problems that may develop before the next visit here. ° °Total time spent: 30 mins including face to face time and time spent for planning, charting and coordination of care ° °Vinay K Gudena, MD, MPH °06/23/2021 ° °I, Kirstyn Evans, am acting as scribe for Dr.  Vinay Gudena. ° °I have reviewed the above documentation for accuracy and completeness, and I agree with the above. ° ° ° ° ° ° °

## 2021-06-23 ENCOUNTER — Inpatient Hospital Stay: Payer: No Typology Code available for payment source

## 2021-06-23 ENCOUNTER — Inpatient Hospital Stay: Payer: Self-pay | Attending: Genetic Counselor | Admitting: Hematology and Oncology

## 2021-06-23 ENCOUNTER — Encounter: Payer: Self-pay | Admitting: *Deleted

## 2021-06-23 ENCOUNTER — Other Ambulatory Visit: Payer: Self-pay | Admitting: *Deleted

## 2021-06-23 DIAGNOSIS — C773 Secondary and unspecified malignant neoplasm of axilla and upper limb lymph nodes: Secondary | ICD-10-CM | POA: Insufficient documentation

## 2021-06-23 DIAGNOSIS — Z170 Estrogen receptor positive status [ER+]: Secondary | ICD-10-CM

## 2021-06-23 DIAGNOSIS — C50412 Malignant neoplasm of upper-outer quadrant of left female breast: Secondary | ICD-10-CM | POA: Insufficient documentation

## 2021-06-23 DIAGNOSIS — Z79899 Other long term (current) drug therapy: Secondary | ICD-10-CM | POA: Insufficient documentation

## 2021-06-23 DIAGNOSIS — Z9013 Acquired absence of bilateral breasts and nipples: Secondary | ICD-10-CM | POA: Insufficient documentation

## 2021-06-23 NOTE — Assessment & Plan Note (Addendum)
06/07/2021:Bilateral mastectomies: Left mastectomy: Grade 1 IDC with DCIS, 1.5 cm, margins negative, 1/3 lymph node positive ER 90%, PR 80%, HER2 equivocal by IHC FISH negative ratio 1.26, Ki-67 5% Right mastectomy: Grade 1 IDC with DCIS 0.5 cm, 0/3 lymph nodes negative ER 100%, PR 60%, HER2 equivocal by IHC, FISH negative ratio 1.19, Ki-67 1% MammaPrint: Low risk  Pathology counseling: I discussed the final pathology report of the patient provided  a copy of this report. I discussed the margins as well as lymph node surgeries. We also discussed the final staging along with previously performed ER/PR and HER-2/neu testing.  Treatment plan: 1. Adjuvant radiation therapy followed by 2. Adjuvant antiestrogen therapy  Does not qualify for Verzenio because of low Ki-67. Return to clinic after radiation is complete

## 2021-06-24 LAB — FOLLICLE STIMULATING HORMONE: FSH: 118 m[IU]/mL

## 2021-06-28 ENCOUNTER — Ambulatory Visit: Payer: No Typology Code available for payment source

## 2021-07-01 ENCOUNTER — Encounter: Payer: Self-pay | Admitting: *Deleted

## 2021-07-03 LAB — ESTRADIOL, ULTRA SENS: Estradiol, Sensitive: <2.5 pg/mL

## 2021-07-05 ENCOUNTER — Other Ambulatory Visit: Payer: Self-pay

## 2021-07-05 ENCOUNTER — Ambulatory Visit: Payer: Self-pay | Attending: General Surgery

## 2021-07-05 DIAGNOSIS — M25612 Stiffness of left shoulder, not elsewhere classified: Secondary | ICD-10-CM | POA: Insufficient documentation

## 2021-07-05 DIAGNOSIS — M25611 Stiffness of right shoulder, not elsewhere classified: Secondary | ICD-10-CM | POA: Insufficient documentation

## 2021-07-05 DIAGNOSIS — R293 Abnormal posture: Secondary | ICD-10-CM | POA: Insufficient documentation

## 2021-07-05 DIAGNOSIS — C50412 Malignant neoplasm of upper-outer quadrant of left female breast: Secondary | ICD-10-CM | POA: Insufficient documentation

## 2021-07-05 DIAGNOSIS — Z170 Estrogen receptor positive status [ER+]: Secondary | ICD-10-CM | POA: Insufficient documentation
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
## 2021-07-07 ENCOUNTER — Other Ambulatory Visit: Payer: Self-pay

## 2021-07-07 ENCOUNTER — Ambulatory Visit: Payer: No Typology Code available for payment source | Attending: General Surgery

## 2021-07-07 DIAGNOSIS — M25611 Stiffness of right shoulder, not elsewhere classified: Secondary | ICD-10-CM | POA: Insufficient documentation

## 2021-07-07 DIAGNOSIS — Z170 Estrogen receptor positive status [ER+]: Secondary | ICD-10-CM | POA: Insufficient documentation

## 2021-07-07 DIAGNOSIS — M25612 Stiffness of left shoulder, not elsewhere classified: Secondary | ICD-10-CM | POA: Insufficient documentation

## 2021-07-07 DIAGNOSIS — C50412 Malignant neoplasm of upper-outer quadrant of left female breast: Secondary | ICD-10-CM | POA: Insufficient documentation

## 2021-07-07 DIAGNOSIS — R293 Abnormal posture: Secondary | ICD-10-CM | POA: Insufficient documentation

## 2021-07-07 NOTE — Patient Instructions (Signed)
SHOULDER: Flexion - Supine Kasandra Knudsen)        Cancer Rehab [phone] ? ? ? ?Hold cane in both hands. Raise arms up overhead. Do not allow back to arch. Hold _5__ seconds. Do __5-10__ times; __1-2__ times a day. ?Hands shoulder width ?Hands slightly wider than shoulder width (V) position ?Access Code: [email or web address] ?URL: [email or web address] ?Date: 07/07/2021 ?Prepared by: Cheral Almas ? ?Exercises ?Supine Lower Trunk Rotation - 1 x daily - 7 x weekly - 1 sets - 3 reps - 15 hold ? ?
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
## 2021-07-08 ENCOUNTER — Ambulatory Visit
Admission: RE | Admit: 2021-07-08 | Discharge: 2021-07-08 | Disposition: A | Discharge status: Home / Self Care | Payer: Self-pay | Source: Ambulatory Visit | Attending: Radiation Oncology | Admitting: Radiation Oncology

## 2021-07-08 DIAGNOSIS — C50412 Malignant neoplasm of upper-outer quadrant of left female breast: Secondary | ICD-10-CM | POA: Insufficient documentation

## 2021-07-08 DIAGNOSIS — Z510 Encounter for antineoplastic radiation therapy: Secondary | ICD-10-CM | POA: Insufficient documentation

## 2021-07-12 ENCOUNTER — Other Ambulatory Visit: Payer: Self-pay

## 2021-07-12 ENCOUNTER — Ambulatory Visit: Payer: No Typology Code available for payment source | Admitting: Physical Therapy

## 2021-07-12 ENCOUNTER — Encounter: Payer: Self-pay | Admitting: Physical Therapy

## 2021-07-12 DIAGNOSIS — M25611 Stiffness of right shoulder, not elsewhere classified: Secondary | ICD-10-CM

## 2021-07-12 DIAGNOSIS — R293 Abnormal posture: Secondary | ICD-10-CM

## 2021-07-12 DIAGNOSIS — M25612 Stiffness of left shoulder, not elsewhere classified: Secondary | ICD-10-CM

## 2021-07-12 DIAGNOSIS — C50412 Malignant neoplasm of upper-outer quadrant of left female breast: Secondary | ICD-10-CM
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
## 2021-07-14 ENCOUNTER — Encounter: Payer: Self-pay | Admitting: Physical Therapy

## 2021-07-14 ENCOUNTER — Ambulatory Visit: Payer: No Typology Code available for payment source | Admitting: Physical Therapy

## 2021-07-14 ENCOUNTER — Other Ambulatory Visit: Payer: Self-pay

## 2021-07-14 DIAGNOSIS — Z170 Estrogen receptor positive status [ER+]: Secondary | ICD-10-CM

## 2021-07-14 DIAGNOSIS — M25612 Stiffness of left shoulder, not elsewhere classified: Secondary | ICD-10-CM

## 2021-07-14 DIAGNOSIS — M25611 Stiffness of right shoulder, not elsewhere classified: Secondary | ICD-10-CM

## 2021-07-14 DIAGNOSIS — R293 Abnormal posture: Secondary | ICD-10-CM
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
## 2021-07-15 ENCOUNTER — Encounter: Payer: Self-pay | Admitting: *Deleted

## 2021-07-18 ENCOUNTER — Ambulatory Visit
Admission: RE | Admit: 2021-07-18 | Discharge: 2021-07-18 | Disposition: A | Discharge status: Home / Self Care | Payer: Self-pay | Source: Ambulatory Visit | Attending: Radiation Oncology | Admitting: Radiation Oncology

## 2021-07-18 ENCOUNTER — Telehealth: Payer: Self-pay | Admitting: Hematology and Oncology

## 2021-07-18 ENCOUNTER — Other Ambulatory Visit: Payer: Self-pay

## 2021-07-18 NOTE — Telephone Encounter (Signed)
.  Called pt per 3/10 inbasket , Patient was unavailable, a message with appt time and date was left with number on file.   ?

## 2021-07-19 ENCOUNTER — Ambulatory Visit
Admission: RE | Admit: 2021-07-19 | Discharge: 2021-07-19 | Disposition: A | Discharge status: Home / Self Care | Payer: Self-pay | Source: Ambulatory Visit | Attending: Radiation Oncology | Admitting: Radiation Oncology

## 2021-07-19 ENCOUNTER — Ambulatory Visit: Payer: Self-pay

## 2021-07-19 DIAGNOSIS — C50412 Malignant neoplasm of upper-outer quadrant of left female breast: Secondary | ICD-10-CM

## 2021-07-19 DIAGNOSIS — M25611 Stiffness of right shoulder, not elsewhere classified: Secondary | ICD-10-CM

## 2021-07-19 DIAGNOSIS — R293 Abnormal posture: Secondary | ICD-10-CM

## 2021-07-19 DIAGNOSIS — M25612 Stiffness of left shoulder, not elsewhere classified: Secondary | ICD-10-CM
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
## 2021-07-20 ENCOUNTER — Other Ambulatory Visit: Payer: Self-pay

## 2021-07-20 ENCOUNTER — Ambulatory Visit
Admission: RE | Admit: 2021-07-20 | Discharge: 2021-07-20 | Disposition: A | Discharge status: Home / Self Care | Payer: Self-pay | Source: Ambulatory Visit | Attending: Radiation Oncology | Admitting: Radiation Oncology

## 2021-07-21 ENCOUNTER — Ambulatory Visit
Admission: RE | Admit: 2021-07-21 | Discharge: 2021-07-21 | Disposition: A | Discharge status: Home / Self Care | Payer: Self-pay | Source: Ambulatory Visit | Attending: Radiation Oncology | Admitting: Radiation Oncology

## 2021-07-22 ENCOUNTER — Ambulatory Visit
Admission: RE | Admit: 2021-07-22 | Discharge: 2021-07-22 | Disposition: A | Discharge status: Home / Self Care | Payer: Self-pay | Source: Ambulatory Visit | Attending: Radiation Oncology | Admitting: Radiation Oncology

## 2021-07-22 ENCOUNTER — Other Ambulatory Visit: Payer: Self-pay

## 2021-07-22 ENCOUNTER — Ambulatory Visit: Payer: Self-pay

## 2021-07-22 ENCOUNTER — Encounter: Payer: Self-pay | Admitting: *Deleted

## 2021-07-22 DIAGNOSIS — M25611 Stiffness of right shoulder, not elsewhere classified: Secondary | ICD-10-CM

## 2021-07-22 DIAGNOSIS — M25612 Stiffness of left shoulder, not elsewhere classified: Secondary | ICD-10-CM

## 2021-07-22 DIAGNOSIS — R293 Abnormal posture: Secondary | ICD-10-CM

## 2021-07-22 DIAGNOSIS — C50412 Malignant neoplasm of upper-outer quadrant of left female breast: Secondary | ICD-10-CM

## 2021-07-22 DIAGNOSIS — C50511 Malignant neoplasm of lower-outer quadrant of right female breast: Secondary | ICD-10-CM

## 2021-07-22 MED ORDER — RADIAPLEXRX EX GEL: Freq: Once | CUTANEOUS | Status: AC

## 2021-07-22 NOTE — Progress Notes (Signed)
Pt here for patient teaching.  Pt given Radiation and You booklet, skin care instructions, and Radiaplex.  Patient was advised to obtain over the counter aluminum free deodorant.  Reviewed areas of pertinence such as fatigue, hair loss, skin changes, breast tenderness, and breast swelling. Pt able to give teach back of to pat skin and use unscented/gentle soap,apply Radiaplex bid, avoid applying anything to skin within 4 hours of treatment, avoid wearing an under wire bra, and to use an electric razor if they must shave. Pt verbalizes understanding of information given and will contact nursing with any questions or concerns.   ? ?Sierra Newman. Leonie Green, BSN  ?
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
## 2021-07-25 ENCOUNTER — Other Ambulatory Visit: Payer: Self-pay

## 2021-07-25 ENCOUNTER — Ambulatory Visit
Admission: RE | Admit: 2021-07-25 | Discharge: 2021-07-25 | Disposition: A | Discharge status: Home / Self Care | Payer: Self-pay | Source: Ambulatory Visit | Attending: Radiation Oncology | Admitting: Radiation Oncology

## 2021-07-26 ENCOUNTER — Ambulatory Visit: Payer: Self-pay

## 2021-07-26 ENCOUNTER — Ambulatory Visit
Admission: RE | Admit: 2021-07-26 | Discharge: 2021-07-26 | Disposition: A | Discharge status: Home / Self Care | Payer: Self-pay | Source: Ambulatory Visit | Attending: Radiation Oncology | Admitting: Radiation Oncology

## 2021-07-26 ENCOUNTER — Other Ambulatory Visit: Payer: Self-pay

## 2021-07-26 DIAGNOSIS — M25612 Stiffness of left shoulder, not elsewhere classified: Secondary | ICD-10-CM

## 2021-07-26 DIAGNOSIS — R293 Abnormal posture: Secondary | ICD-10-CM

## 2021-07-26 DIAGNOSIS — M25611 Stiffness of right shoulder, not elsewhere classified: Secondary | ICD-10-CM

## 2021-07-26 DIAGNOSIS — C50412 Malignant neoplasm of upper-outer quadrant of left female breast: Secondary | ICD-10-CM
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
## 2021-07-27 ENCOUNTER — Ambulatory Visit
Admission: RE | Admit: 2021-07-27 | Discharge: 2021-07-27 | Disposition: A | Discharge status: Home / Self Care | Payer: Self-pay | Source: Ambulatory Visit | Attending: Radiation Oncology | Admitting: Radiation Oncology

## 2021-07-28 ENCOUNTER — Ambulatory Visit: Payer: Self-pay

## 2021-07-29 ENCOUNTER — Other Ambulatory Visit: Payer: Self-pay

## 2021-07-29 ENCOUNTER — Ambulatory Visit
Admission: RE | Admit: 2021-07-29 | Discharge: 2021-07-29 | Disposition: A | Discharge status: Home / Self Care | Payer: Self-pay | Source: Ambulatory Visit | Attending: Radiation Oncology | Admitting: Radiation Oncology

## 2021-08-01 ENCOUNTER — Ambulatory Visit
Admission: RE | Admit: 2021-08-01 | Discharge: 2021-08-01 | Disposition: A | Discharge status: Home / Self Care | Payer: Self-pay | Source: Ambulatory Visit | Attending: Radiation Oncology | Admitting: Radiation Oncology

## 2021-08-01 ENCOUNTER — Other Ambulatory Visit: Payer: Self-pay

## 2021-08-02 ENCOUNTER — Ambulatory Visit
Admission: RE | Admit: 2021-08-02 | Discharge: 2021-08-02 | Disposition: A | Discharge status: Home / Self Care | Payer: Self-pay | Source: Ambulatory Visit | Attending: Radiation Oncology | Admitting: Radiation Oncology

## 2021-08-02 ENCOUNTER — Ambulatory Visit: Payer: Self-pay

## 2021-08-02 DIAGNOSIS — Z170 Estrogen receptor positive status [ER+]: Secondary | ICD-10-CM

## 2021-08-02 DIAGNOSIS — M25612 Stiffness of left shoulder, not elsewhere classified: Secondary | ICD-10-CM

## 2021-08-02 DIAGNOSIS — R293 Abnormal posture: Secondary | ICD-10-CM

## 2021-08-02 DIAGNOSIS — M25611 Stiffness of right shoulder, not elsewhere classified: Secondary | ICD-10-CM
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
## 2021-08-03 ENCOUNTER — Ambulatory Visit
Admission: RE | Admit: 2021-08-03 | Discharge: 2021-08-03 | Disposition: A | Discharge status: Home / Self Care | Payer: Self-pay | Source: Ambulatory Visit | Attending: Radiation Oncology | Admitting: Radiation Oncology

## 2021-08-04 ENCOUNTER — Ambulatory Visit
Admission: RE | Admit: 2021-08-04 | Discharge: 2021-08-04 | Disposition: A | Discharge status: Home / Self Care | Payer: Self-pay | Source: Ambulatory Visit | Attending: Radiation Oncology | Admitting: Radiation Oncology

## 2021-08-05 ENCOUNTER — Ambulatory Visit
Admission: RE | Admit: 2021-08-05 | Discharge: 2021-08-05 | Disposition: A | Discharge status: Home / Self Care | Payer: Self-pay | Source: Ambulatory Visit | Attending: Radiation Oncology | Admitting: Radiation Oncology

## 2021-08-08 ENCOUNTER — Ambulatory Visit
Admission: RE | Admit: 2021-08-08 | Discharge: 2021-08-08 | Disposition: A | Discharge status: Home / Self Care | Payer: No Typology Code available for payment source | Source: Ambulatory Visit | Attending: Radiation Oncology | Admitting: Radiation Oncology

## 2021-08-08 DIAGNOSIS — C50412 Malignant neoplasm of upper-outer quadrant of left female breast: Secondary | ICD-10-CM | POA: Insufficient documentation

## 2021-08-08 DIAGNOSIS — Z510 Encounter for antineoplastic radiation therapy: Secondary | ICD-10-CM | POA: Insufficient documentation

## 2021-08-09 ENCOUNTER — Ambulatory Visit
Admission: RE | Admit: 2021-08-09 | Discharge: 2021-08-09 | Disposition: A | Discharge status: Home / Self Care | Payer: No Typology Code available for payment source | Source: Ambulatory Visit | Attending: Radiation Oncology | Admitting: Radiation Oncology

## 2021-08-10 ENCOUNTER — Ambulatory Visit
Admission: RE | Admit: 2021-08-10 | Discharge: 2021-08-10 | Disposition: A | Discharge status: Home / Self Care | Payer: No Typology Code available for payment source | Source: Ambulatory Visit | Attending: Radiation Oncology | Admitting: Radiation Oncology

## 2021-08-11 ENCOUNTER — Ambulatory Visit
Admission: RE | Admit: 2021-08-11 | Discharge: 2021-08-11 | Disposition: A | Discharge status: Home / Self Care | Payer: No Typology Code available for payment source | Source: Ambulatory Visit | Attending: Radiation Oncology | Admitting: Radiation Oncology

## 2021-08-12 ENCOUNTER — Ambulatory Visit
Admission: RE | Admit: 2021-08-12 | Discharge: 2021-08-12 | Disposition: A | Discharge status: Home / Self Care | Payer: No Typology Code available for payment source | Source: Ambulatory Visit | Attending: Radiation Oncology | Admitting: Radiation Oncology

## 2021-08-15 ENCOUNTER — Ambulatory Visit
Admission: RE | Admit: 2021-08-15 | Discharge: 2021-08-15 | Disposition: A | Discharge status: Home / Self Care | Payer: No Typology Code available for payment source | Source: Ambulatory Visit | Attending: Radiation Oncology | Admitting: Radiation Oncology

## 2021-08-15 ENCOUNTER — Other Ambulatory Visit: Payer: Self-pay

## 2021-08-15 NOTE — Progress Notes (Signed)
? ?Patient Care Team: ?Linda Hedges, DO as PCP - General (Obstetrics and Gynecology) ?Rockwell Germany, RN as Oncology Nurse Navigator ?Mauro Kaufmann, RN as Oncology Nurse Navigator ?Nicholas Lose, MD as Consulting Physician (Hematology and Oncology) ? ?DIAGNOSIS:  ?Encounter Diagnosis  ?Name Primary?  ? Malignant neoplasm of upper-outer quadrant of left breast in female, estrogen receptor positive ([place])   ? ? ?SUMMARY OF ONCOLOGIC HISTORY: ?Oncology History  ?Malignant neoplasm of upper-outer quadrant of left breast in female, estrogen receptor positive ([place])  ?04/14/2021 Initial Diagnosis  ? Palpable lump in the left breast, ultrasound and biopsy revealed grade 1-2 IDC with DCIS ER 80%, PR 80%, HER2 2+ by IHC FISH negative, Ki-67 15% ?  ?04/25/2021 Breast MRI  ? 3.0 x 2.0 x 1.8 cm enhancing mass in the upper left breast consistent with the patient's biopsy-proven malignancy, and suspicious 1.1 cm mass in the far posterior lower outer quadrant of the right breast abutting the chest wall.  ?  ? Genetic Testing  ? Negative genetic testing. No pathogenic variants identified on the [facility] CancerNext-Expanded+RNA panel. The report date is 05/13/2021. ? ?The CancerNext-Expanded + RNAinsight gene panel offered by [employer] and includes sequencing and rearrangement analysis for the following 77 genes: IP, ALK, APC*, ATM*, AXIN2, BAP1, BARD1, BLM, BMPR1A, BRCA1*, BRCA2*, BRIP1*, CDC73, CDH1*,CDK4, CDKN1B, CDKN2A, CHEK2*, CTNNA1, DICER1, FANCC, FH, FLCN, GALNT12, KIF1B, LZTR1, MAX, MEN1, MET, MLH1*, MSH2*, MSH3, MSH6*, MUTYH*, NBN, NF1*, NF2, NTHL1, PALB2*, PHOX2B, PMS2*, POT1, PRKAR1A, PTCH1, PTEN*, RAD51C*, RAD51D*,RB1, RECQL, RET, SDHA, SDHAF2, SDHB, SDHC, SDHD, SMAD4, SMARCA4, SMARCB1, SMARCE1, STK11, SUFU, TMEM127, TP53*,TSC1, TSC2, VHL and XRCC2 (sequencing and deletion/duplication); EGFR, EGLN1, HOXB13, KIT, MITF, PDGFRA, POLD1 and POLE (sequencing only); EPCAM and GREM1 (deletion/duplication only). ?  ?06/07/2021  Cancer Staging  ? Staging form: Breast, AJCC 8th Edition ?- Pathologic stage from 06/07/2021: Stage IA (pT1c, pN1a, cM0, G1, ER+, PR+, HER2-) - Signed by Gardenia Phlegm, NP on 06/09/2021 ?Stage prefix: Initial diagnosis ?Histologic grading system: 3 grade system ? ?  ?06/07/2021 Surgery  ? Bilateral mastectomies: ?Left mastectomy: Grade 1 IDC with DCIS, 1.5 cm, margins negative, 1/4 lymph node positive ER 90%, PR 80%, HER2 equivocal by IHC FISH negative ratio 1.26, Ki-67 5% ?Right mastectomy: Grade 1 IDC with DCIS 0.5 cm, 0/3 lymph nodes negative ER 100%, PR 60%, HER2 equivocal by IHC, FISH negative ratio 1.19, Ki-67 1% ?  ? ? ?CHIEF COMPLIANT: Follow-up after radiation ? ?INTERVAL HISTORY: Sierra Newman is a 51 y.o. with above-mentioned history of left breast cancer. She presents to the clinic today for follow-up. She complained of fatigue and some itching and redness under arm pit.  ? ?ALLERGIES:  has No Known Allergies. ? ?MEDICATIONS:  ?Current Outpatient Medications  ?Medication Sig Dispense Refill  ? Ibuprofen 200 MG CAPS 200 mg.    ? [START ON 09/26/2021] letrozole (FEMARA) 2.5 MG tablet Take 1 tablet (2.5 mg total) by mouth daily. 90 tablet 3  ? acetaminophen (TYLENOL) 500 MG tablet Take 1,000 mg by mouth every 6 (six) hours as needed for mild pain.    ? ALPRAZolam (XANAX) 0.25 MG tablet Take 0.25 mg by mouth at bedtime as needed for anxiety.    ? fluticasone (FLONASE) 50 MCG/ACT nasal spray Place 1 spray into both nostrils daily.    ? gabapentin (NEURONTIN) 100 MG capsule Take 100 mg by mouth at bedtime.    ? L-Lysine 1000 MG TABS Take by mouth.    ? lamoTRIgine (LAMICTAL) 150 MG tablet  Take 150 mg by mouth daily.    ? melatonin 5 MG TABS Take 5 mg by mouth at bedtime.    ? prazosin (MINIPRESS) 1 MG capsule Take 1 mg by mouth at bedtime.    ? sertraline (ZOLOFT) 50 MG tablet Take 50 mg by mouth daily.    ? ?No current facility-administered medications for this visit.  ? ? ?PHYSICAL EXAMINATION: ?ECOG  PERFORMANCE STATUS: 1 - Symptomatic but completely ambulatory ? ?Vitals:  ? 08/29/21 1425  ?BP: (!) 167/96  ?Pulse: 86  ?Resp: 19  ?Temp: 97.7 ?F (36.5 ?C)  ?SpO2: 100%  ? ?Filed Weights  ? 08/29/21 1425  ?Weight: 137 lb 3.2 oz (62.2 kg)  ? ?  ? ?LABORATORY DATA:  ?I have reviewed the data as listed ?   ? View : No data to display.  ?  ?  ?  ? ? ?No results found for: WBC, HGB, HCT, MCV, PLT, NEUTROABS ? ?ASSESSMENT & PLAN:  ?Malignant neoplasm of upper-outer quadrant of left breast in female, estrogen receptor positive ([place]) ?06/07/2021:Bilateral mastectomies: ?Left mastectomy: Grade 1 IDC with DCIS, 1.5 cm, margins negative, 1/4 lymph node positive ER 90%, PR 80%, HER2 equivocal by IHC FISH negative ratio 1.26, Ki-67 5% ?Right mastectomy: Grade 1 IDC with DCIS 0.5 cm, 0/3 lymph nodes negative ER 100%, PR 60%, HER2 equivocal by IHC, FISH negative ratio 1.19, Ki-67 1% ?MammaPrint: Low risk ?  ?  ?Treatment plan: ?1. Adjuvant radiation therapy completing 09/01/2021 ?2. Adjuvant antiestrogen therapy: Because we are not sure whether she is currently menopausal we will obtain [place] and estradiol levels ([place] 118, estradiol less than 2.5: Patient is menopausal) therefore we will start her on letrozole 2.5 mg daily x5-7 years ?  ?We will do [facility] circulating tumor DNA testing ?Bone density will be ordered.  This will be done at [facility]. ? ?Return to clinic  in 3 months for survivorship care plan visit ? ? ? ?No orders of the defined types were placed in this encounter. ? ?The patient has a good understanding of the overall plan. she agrees with it. she will call with any problems that may develop before the next visit here. ?Total time spent: 30 mins including face to face time and time spent for planning, charting and co-ordination of care ? ? Harriette Ohara, MD ?08/29/21 ? ? ? I Gardiner Coins am scribing for Dr. Lindi Adie ? ?I have reviewed the above documentation for accuracy and completeness, and I agree with the  above. ?  ?

## 2021-08-16 ENCOUNTER — Encounter: Payer: Self-pay | Admitting: Physical Therapy

## 2021-08-16 ENCOUNTER — Ambulatory Visit: Payer: No Typology Code available for payment source | Attending: General Surgery | Admitting: Physical Therapy

## 2021-08-16 ENCOUNTER — Ambulatory Visit
Admission: RE | Admit: 2021-08-16 | Discharge: 2021-08-16 | Disposition: A | Discharge status: Home / Self Care | Payer: No Typology Code available for payment source | Source: Ambulatory Visit | Attending: Radiation Oncology | Admitting: Radiation Oncology

## 2021-08-16 DIAGNOSIS — R293 Abnormal posture: Secondary | ICD-10-CM | POA: Insufficient documentation

## 2021-08-16 DIAGNOSIS — Z170 Estrogen receptor positive status [ER+]: Secondary | ICD-10-CM | POA: Insufficient documentation

## 2021-08-16 DIAGNOSIS — M25612 Stiffness of left shoulder, not elsewhere classified: Secondary | ICD-10-CM | POA: Insufficient documentation

## 2021-08-16 DIAGNOSIS — M25611 Stiffness of right shoulder, not elsewhere classified: Secondary | ICD-10-CM | POA: Insufficient documentation

## 2021-08-16 DIAGNOSIS — C50412 Malignant neoplasm of upper-outer quadrant of left female breast: Secondary | ICD-10-CM | POA: Insufficient documentation
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
## 2021-08-17 ENCOUNTER — Other Ambulatory Visit: Payer: Self-pay

## 2021-08-17 ENCOUNTER — Ambulatory Visit
Admission: RE | Admit: 2021-08-17 | Discharge: 2021-08-17 | Disposition: A | Discharge status: Home / Self Care | Payer: No Typology Code available for payment source | Source: Ambulatory Visit | Attending: Radiation Oncology | Admitting: Radiation Oncology

## 2021-08-18 ENCOUNTER — Ambulatory Visit: Payer: No Typology Code available for payment source | Admitting: Physical Therapy

## 2021-08-18 ENCOUNTER — Ambulatory Visit
Admission: RE | Admit: 2021-08-18 | Discharge: 2021-08-18 | Disposition: A | Discharge status: Home / Self Care | Payer: No Typology Code available for payment source | Source: Ambulatory Visit | Attending: Radiation Oncology | Admitting: Radiation Oncology

## 2021-08-18 ENCOUNTER — Encounter: Payer: Self-pay | Admitting: Physical Therapy

## 2021-08-18 DIAGNOSIS — Z170 Estrogen receptor positive status [ER+]: Secondary | ICD-10-CM

## 2021-08-18 DIAGNOSIS — M25612 Stiffness of left shoulder, not elsewhere classified: Secondary | ICD-10-CM

## 2021-08-18 DIAGNOSIS — M25611 Stiffness of right shoulder, not elsewhere classified: Secondary | ICD-10-CM

## 2021-08-18 DIAGNOSIS — R293 Abnormal posture: Secondary | ICD-10-CM
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
## 2021-08-19 ENCOUNTER — Ambulatory Visit
Admission: RE | Admit: 2021-08-19 | Discharge: 2021-08-19 | Disposition: A | Discharge status: Home / Self Care | Payer: No Typology Code available for payment source | Source: Ambulatory Visit | Attending: Radiation Oncology | Admitting: Radiation Oncology

## 2021-08-19 ENCOUNTER — Ambulatory Visit
Admission: RE | Admit: 2021-08-19 | Payer: No Typology Code available for payment source | Source: Ambulatory Visit | Admitting: Radiation Oncology

## 2021-08-19 ENCOUNTER — Other Ambulatory Visit: Payer: Self-pay

## 2021-08-22 ENCOUNTER — Ambulatory Visit
Admission: RE | Admit: 2021-08-22 | Discharge: 2021-08-22 | Disposition: A | Discharge status: Home / Self Care | Payer: No Typology Code available for payment source | Source: Ambulatory Visit | Attending: Radiation Oncology | Admitting: Radiation Oncology

## 2021-08-22 ENCOUNTER — Other Ambulatory Visit: Payer: Self-pay

## 2021-08-23 ENCOUNTER — Other Ambulatory Visit: Payer: Self-pay

## 2021-08-23 ENCOUNTER — Ambulatory Visit
Admission: RE | Admit: 2021-08-23 | Discharge: 2021-08-23 | Disposition: A | Discharge status: Home / Self Care | Payer: No Typology Code available for payment source | Source: Ambulatory Visit | Attending: Radiation Oncology | Admitting: Radiation Oncology

## 2021-08-23 ENCOUNTER — Ambulatory Visit: Payer: No Typology Code available for payment source

## 2021-08-23 DIAGNOSIS — M25612 Stiffness of left shoulder, not elsewhere classified: Secondary | ICD-10-CM

## 2021-08-23 DIAGNOSIS — Z170 Estrogen receptor positive status [ER+]: Secondary | ICD-10-CM

## 2021-08-23 DIAGNOSIS — M25611 Stiffness of right shoulder, not elsewhere classified: Secondary | ICD-10-CM

## 2021-08-23 DIAGNOSIS — R293 Abnormal posture: Secondary | ICD-10-CM

## 2021-08-23 LAB — RAD ONC ARIA SESSION SUMMARY
Course Elapsed Days: 36
Plan Fractions Treated to Date: 13
Plan Fractions Treated to Date: 26
Plan Prescribed Dose Per Fraction: 1.8 Gy
Plan Prescribed Dose Per Fraction: 1.8 Gy
Plan Total Fractions Prescribed: 14
Plan Total Fractions Prescribed: 28
Plan Total Prescribed Dose: 25.2 Gy
Plan Total Prescribed Dose: 50.4 Gy
Reference Point Dosage Given to Date: 46.8 Gy
Reference Point Dosage Given to Date: 46.8 Gy
Reference Point Session Dosage Given: 1.8 Gy
Reference Point Session Dosage Given: 1.8 Gy
Session Number: 26
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
## 2021-08-24 ENCOUNTER — Other Ambulatory Visit: Payer: Self-pay

## 2021-08-24 ENCOUNTER — Ambulatory Visit
Admission: RE | Admit: 2021-08-24 | Discharge: 2021-08-24 | Disposition: A | Discharge status: Home / Self Care | Payer: No Typology Code available for payment source | Source: Ambulatory Visit | Attending: Radiation Oncology | Admitting: Radiation Oncology

## 2021-08-24 LAB — RAD ONC ARIA SESSION SUMMARY
Course Elapsed Days: 37
Plan Fractions Treated to Date: 14
Plan Fractions Treated to Date: 27
Plan Prescribed Dose Per Fraction: 1.8 Gy
Plan Prescribed Dose Per Fraction: 1.8 Gy
Plan Total Fractions Prescribed: 14
Plan Total Fractions Prescribed: 28
Plan Total Prescribed Dose: 25.2 Gy
Plan Total Prescribed Dose: 50.4 Gy
Reference Point Dosage Given to Date: 48.6 Gy
Reference Point Dosage Given to Date: 48.6 Gy
Reference Point Session Dosage Given: 1.8 Gy
Reference Point Session Dosage Given: 1.8 Gy
Session Number: 27

## 2021-08-25 ENCOUNTER — Other Ambulatory Visit: Payer: Self-pay

## 2021-08-25 ENCOUNTER — Encounter: Payer: Self-pay | Admitting: *Deleted

## 2021-08-25 ENCOUNTER — Ambulatory Visit
Admission: RE | Admit: 2021-08-25 | Discharge: 2021-08-25 | Disposition: A | Discharge status: Home / Self Care | Payer: No Typology Code available for payment source | Source: Ambulatory Visit | Attending: Radiation Oncology | Admitting: Radiation Oncology

## 2021-08-25 DIAGNOSIS — Z170 Estrogen receptor positive status [ER+]: Secondary | ICD-10-CM

## 2021-08-25 LAB — RAD ONC ARIA SESSION SUMMARY
Course Elapsed Days: 38
Plan Fractions Treated to Date: 14
Plan Fractions Treated to Date: 28
Plan Prescribed Dose Per Fraction: 1.8 Gy
Plan Prescribed Dose Per Fraction: 1.8 Gy
Plan Total Fractions Prescribed: 14
Plan Total Fractions Prescribed: 28
Plan Total Prescribed Dose: 25.2 Gy
Plan Total Prescribed Dose: 50.4 Gy
Reference Point Dosage Given to Date: 50.4 Gy
Reference Point Dosage Given to Date: 50.4 Gy
Reference Point Session Dosage Given: 1.8 Gy
Reference Point Session Dosage Given: 1.8 Gy
Session Number: 28

## 2021-08-26 ENCOUNTER — Ambulatory Visit: Payer: No Typology Code available for payment source

## 2021-08-26 ENCOUNTER — Ambulatory Visit
Admission: RE | Admit: 2021-08-26 | Discharge: 2021-08-26 | Disposition: A | Discharge status: Home / Self Care | Payer: No Typology Code available for payment source | Source: Ambulatory Visit | Attending: Radiation Oncology | Admitting: Radiation Oncology

## 2021-08-26 ENCOUNTER — Other Ambulatory Visit: Payer: Self-pay

## 2021-08-26 LAB — RAD ONC ARIA SESSION SUMMARY
Course Elapsed Days: 39
Plan Fractions Treated to Date: 1
Plan Prescribed Dose Per Fraction: 2 Gy
Plan Total Fractions Prescribed: 5
Plan Total Prescribed Dose: 10 Gy
Reference Point Dosage Given to Date: 52.4 Gy
Reference Point Session Dosage Given: 2 Gy
Session Number: 29

## 2021-08-29 ENCOUNTER — Ambulatory Visit
Admission: RE | Admit: 2021-08-29 | Discharge: 2021-08-29 | Disposition: A | Discharge status: Home / Self Care | Payer: Self-pay | Source: Ambulatory Visit | Attending: Radiation Oncology | Admitting: Radiation Oncology

## 2021-08-29 ENCOUNTER — Ambulatory Visit: Payer: Self-pay

## 2021-08-29 ENCOUNTER — Other Ambulatory Visit: Payer: Self-pay

## 2021-08-29 ENCOUNTER — Inpatient Hospital Stay: Payer: Self-pay | Attending: Genetic Counselor | Admitting: Hematology and Oncology

## 2021-08-29 DIAGNOSIS — R5383 Other fatigue: Secondary | ICD-10-CM | POA: Insufficient documentation

## 2021-08-29 DIAGNOSIS — C50412 Malignant neoplasm of upper-outer quadrant of left female breast: Secondary | ICD-10-CM | POA: Insufficient documentation

## 2021-08-29 DIAGNOSIS — Z170 Estrogen receptor positive status [ER+]: Secondary | ICD-10-CM | POA: Insufficient documentation

## 2021-08-29 DIAGNOSIS — L299 Pruritus, unspecified: Secondary | ICD-10-CM | POA: Insufficient documentation

## 2021-08-29 DIAGNOSIS — Z79899 Other long term (current) drug therapy: Secondary | ICD-10-CM | POA: Insufficient documentation

## 2021-08-29 LAB — RAD ONC ARIA SESSION SUMMARY
Course Elapsed Days: 42
Plan Fractions Treated to Date: 2
Plan Prescribed Dose Per Fraction: 2 Gy
Plan Total Fractions Prescribed: 5
Plan Total Prescribed Dose: 10 Gy
Reference Point Dosage Given to Date: 54.4 Gy
Reference Point Session Dosage Given: 2 Gy
Session Number: 30

## 2021-08-29 MED ORDER — LETROZOLE 2.5 MG PO TABS: 2.5000 mg | ORAL_TABLET | Freq: Every day | ORAL | 3 refills | Status: DC

## 2021-08-29 NOTE — Assessment & Plan Note (Addendum)
06/07/2021:Bilateral mastectomies: ?Left mastectomy: Grade 1 IDC with DCIS, 1.5 cm, margins negative, 1/4 lymph node positive ER 90%, PR 80%, HER2 equivocal by IHC FISH negative ratio 1.26, Ki-67 5% ?Right mastectomy: Grade 1 IDC with DCIS 0.5 cm, 0/3 lymph nodes negative ER 100%, PR 60%, HER2 equivocal by IHC, FISH negative ratio 1.19, Ki-67 1% ?MammaPrint: Low risk ?  ?? ?Treatment plan: ?1. Adjuvant radiation therapy completing 09/01/2021 ?2. Adjuvant antiestrogen therapy: Because we are not sure whether she is currently menopausal we will obtain [place] and estradiol levels ([place] 118, estradiol less than 2.5: Patient is menopausal) therefore we will start her on letrozole 2.5 mg daily x5-7 years ?  ?We will do [employer] circulating tumor DNA testing ?Bone density will be ordered.  This will be done at [facility]. ? ?Return to clinic  in 3 months for survivorship care plan visit ?

## 2021-08-30 ENCOUNTER — Ambulatory Visit: Payer: No Typology Code available for payment source

## 2021-08-30 ENCOUNTER — Other Ambulatory Visit: Payer: Self-pay

## 2021-08-30 ENCOUNTER — Ambulatory Visit
Admission: RE | Admit: 2021-08-30 | Discharge: 2021-08-30 | Disposition: A | Discharge status: Home / Self Care | Payer: No Typology Code available for payment source | Source: Ambulatory Visit | Attending: Radiation Oncology | Admitting: Radiation Oncology

## 2021-08-30 DIAGNOSIS — Z170 Estrogen receptor positive status [ER+]: Secondary | ICD-10-CM

## 2021-08-30 DIAGNOSIS — M25611 Stiffness of right shoulder, not elsewhere classified: Secondary | ICD-10-CM

## 2021-08-30 DIAGNOSIS — M25612 Stiffness of left shoulder, not elsewhere classified: Secondary | ICD-10-CM

## 2021-08-30 DIAGNOSIS — R293 Abnormal posture: Secondary | ICD-10-CM

## 2021-08-30 LAB — RAD ONC ARIA SESSION SUMMARY
Course Elapsed Days: 43
Plan Fractions Treated to Date: 3
Plan Prescribed Dose Per Fraction: 2 Gy
Plan Total Fractions Prescribed: 5
Plan Total Prescribed Dose: 10 Gy
Reference Point Dosage Given to Date: 56.4 Gy
Reference Point Session Dosage Given: 2 Gy
Session Number: 31

## 2021-08-30 NOTE — Patient Instructions (Signed)
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
## 2021-08-31 ENCOUNTER — Other Ambulatory Visit: Payer: Self-pay

## 2021-08-31 ENCOUNTER — Ambulatory Visit
Admission: RE | Admit: 2021-08-31 | Discharge: 2021-08-31 | Disposition: A | Discharge status: Home / Self Care | Payer: No Typology Code available for payment source | Source: Ambulatory Visit | Attending: Radiation Oncology | Admitting: Radiation Oncology

## 2021-08-31 LAB — RAD ONC ARIA SESSION SUMMARY
Course Elapsed Days: 44
Plan Fractions Treated to Date: 4
Plan Prescribed Dose Per Fraction: 2 Gy
Plan Total Fractions Prescribed: 5
Plan Total Prescribed Dose: 10 Gy
Reference Point Dosage Given to Date: 58.4 Gy
Reference Point Session Dosage Given: 2 Gy
Session Number: 32

## 2021-09-01 ENCOUNTER — Encounter: Payer: Self-pay | Admitting: Radiation Oncology

## 2021-09-01 ENCOUNTER — Other Ambulatory Visit: Payer: Self-pay

## 2021-09-01 ENCOUNTER — Ambulatory Visit
Admission: RE | Admit: 2021-09-01 | Discharge: 2021-09-01 | Disposition: A | Discharge status: Home / Self Care | Payer: No Typology Code available for payment source | Source: Ambulatory Visit | Attending: Radiation Oncology | Admitting: Radiation Oncology

## 2021-09-01 ENCOUNTER — Ambulatory Visit: Payer: No Typology Code available for payment source

## 2021-09-01 DIAGNOSIS — M25611 Stiffness of right shoulder, not elsewhere classified: Secondary | ICD-10-CM

## 2021-09-01 DIAGNOSIS — R293 Abnormal posture: Secondary | ICD-10-CM

## 2021-09-01 DIAGNOSIS — M25612 Stiffness of left shoulder, not elsewhere classified: Secondary | ICD-10-CM

## 2021-09-01 DIAGNOSIS — Z170 Estrogen receptor positive status [ER+]: Secondary | ICD-10-CM

## 2021-09-01 LAB — RAD ONC ARIA SESSION SUMMARY
Course Elapsed Days: 45
Plan Fractions Treated to Date: 5
Plan Prescribed Dose Per Fraction: 2 Gy
Plan Total Fractions Prescribed: 5
Plan Total Prescribed Dose: 10 Gy
Reference Point Dosage Given to Date: 60.4 Gy
Reference Point Session Dosage Given: 2 Gy
Session Number: 33
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
## 2021-09-01 NOTE — Patient Instructions (Addendum)
Self bandaging the arm:   Follow along with this video:  Https://www.[email or web address]/[email or web address]  -OR-  Search in your internet browser: "self bandaging arm MD [place]"   And the video should appear in the video search results "Lymphedema Management: Self bandaging your arm" on YouTube   This video is for caregiver assistance:  https://www.youtube.com/watch?v=[email or web address]  -OR-  Enter " Lymphedema Management: Caregiver Bandaging you arm" into your search browser and your video should appear in the results  PLEASE KEEP YOUR BANDAGES ON AS LONG AS POSSIBLE TO GET THE BEST SWELLING REDUCTION. Should your bandages become uncomfortable or feel too tight, follow these steps: Elevate your extremity higher than your heart.  Try to move your arm or leg joints against the firmness of the bandage to help with moving the fluid and allow the bandages to loosen a bit.  If the bandaging is still is too tight, it is ok to carefully remove the top layer.  There will still be more layers under it that can provide compression to your extremity. Finally, if you STILL have significant pain after trying these steps, it is ok to take the bandage off.  Check your skin carefully for any signs of irritation  PLEASE bring ALL bandage materials back to your next appointment as we will reuse what we can TAKE CARE OF YOUR BANDAGES SO THEY WILL LAST LONGER AND STAY IN BETTER CONDITION Washing bandages:  Wash periodically using a mild detergent in warm water.  Do not use fabric softener or bleach.  Place bandages in a mesh lingerie bag or in a tied off pillow case and use the gentle cycle of the washing machine or hand wash. If you hand wash, you may want to put them in the spin cycle of your washer to get the extra water out, but make sure you put them in a mesh bag first. Do not wring or stretch them while they are wet.  Drying bandages: Lay the bandages out smoothly on a towel away from direct sunlight or  heating sources that can damage the fabric. Rolling bandages in a towel and gently squeezing the towel to remove excess water before laying them out can speed up the process.  If you use a drying rack, place a towel on top of the rack to lay the bandages on.  If they hang down to dry, they fabric could be stretched out and the bandage will lose its compression.   Or, keep bandages in the mesh bag and dry them in the dryer on the low or no heat cycle. Rolling bandages: Please roll your bandages after drying them so they are ready for your next treatment. If they are rolled too loose, they will be difficult to apply.  If rolled too tight, they can get stretched out.   TAKE CARE OF YOUR SKIN Apply a low pH moisturizing lotion to your skin daily Avoid scratching your skin Treat skin irritations quickly  Know the 5 warning signs of infection: redness, pain, warmth to touch, fever and increased swelling.  Call your physician immediately if you notice any of these signs of a possible infection.  

## 2021-09-06 ENCOUNTER — Encounter: Payer: Self-pay | Admitting: Physical Therapy

## 2021-09-06 ENCOUNTER — Ambulatory Visit: Payer: No Typology Code available for payment source | Attending: General Surgery | Admitting: Physical Therapy

## 2021-09-06 ENCOUNTER — Telehealth: Payer: Self-pay | Admitting: *Deleted

## 2021-09-06 ENCOUNTER — Other Ambulatory Visit: Payer: Self-pay | Admitting: *Deleted

## 2021-09-06 DIAGNOSIS — Z170 Estrogen receptor positive status [ER+]: Secondary | ICD-10-CM | POA: Insufficient documentation

## 2021-09-06 DIAGNOSIS — M25611 Stiffness of right shoulder, not elsewhere classified: Secondary | ICD-10-CM | POA: Insufficient documentation

## 2021-09-06 DIAGNOSIS — R293 Abnormal posture: Secondary | ICD-10-CM | POA: Insufficient documentation

## 2021-09-06 DIAGNOSIS — M25612 Stiffness of left shoulder, not elsewhere classified: Secondary | ICD-10-CM | POA: Insufficient documentation

## 2021-09-06 DIAGNOSIS — L599 Disorder of the skin and subcutaneous tissue related to radiation, unspecified: Secondary | ICD-10-CM | POA: Insufficient documentation

## 2021-09-06 DIAGNOSIS — R600 Localized edema: Secondary | ICD-10-CM | POA: Insufficient documentation

## 2021-09-06 DIAGNOSIS — C50412 Malignant neoplasm of upper-outer quadrant of left female breast: Secondary | ICD-10-CM | POA: Insufficient documentation
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
## 2021-09-06 NOTE — Telephone Encounter (Signed)
Faxed order for bone density to [facility]. Patient is scheduled for 5/10 ?

## 2021-09-08 ENCOUNTER — Ambulatory Visit: Payer: No Typology Code available for payment source

## 2021-09-08 DIAGNOSIS — M25611 Stiffness of right shoulder, not elsewhere classified: Secondary | ICD-10-CM

## 2021-09-08 DIAGNOSIS — R293 Abnormal posture: Secondary | ICD-10-CM

## 2021-09-08 DIAGNOSIS — Z170 Estrogen receptor positive status [ER+]: Secondary | ICD-10-CM

## 2021-09-08 DIAGNOSIS — M25612 Stiffness of left shoulder, not elsewhere classified: Secondary | ICD-10-CM
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
## 2021-09-15 ENCOUNTER — Encounter: Payer: Self-pay | Admitting: Physical Therapy

## 2021-09-15 ENCOUNTER — Ambulatory Visit: Payer: No Typology Code available for payment source | Admitting: Physical Therapy

## 2021-09-15 DIAGNOSIS — M25611 Stiffness of right shoulder, not elsewhere classified: Secondary | ICD-10-CM

## 2021-09-15 DIAGNOSIS — M25612 Stiffness of left shoulder, not elsewhere classified: Secondary | ICD-10-CM

## 2021-09-15 DIAGNOSIS — R293 Abnormal posture: Secondary | ICD-10-CM

## 2021-09-15 DIAGNOSIS — L599 Disorder of the skin and subcutaneous tissue related to radiation, unspecified: Secondary | ICD-10-CM

## 2021-09-15 DIAGNOSIS — R600 Localized edema: Secondary | ICD-10-CM

## 2021-09-15 DIAGNOSIS — Z170 Estrogen receptor positive status [ER+]: Secondary | ICD-10-CM
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
## 2021-09-16 ENCOUNTER — Encounter: Payer: Self-pay | Admitting: Hematology and Oncology

## 2021-09-19 ENCOUNTER — Ambulatory Visit: Payer: No Typology Code available for payment source | Admitting: Physical Therapy

## 2021-09-19 ENCOUNTER — Encounter: Payer: Self-pay | Admitting: Physical Therapy

## 2021-09-19 DIAGNOSIS — R600 Localized edema: Secondary | ICD-10-CM

## 2021-09-19 DIAGNOSIS — M25611 Stiffness of right shoulder, not elsewhere classified: Secondary | ICD-10-CM

## 2021-09-19 DIAGNOSIS — R293 Abnormal posture: Secondary | ICD-10-CM

## 2021-09-19 DIAGNOSIS — M25612 Stiffness of left shoulder, not elsewhere classified: Secondary | ICD-10-CM

## 2021-09-19 DIAGNOSIS — L599 Disorder of the skin and subcutaneous tissue related to radiation, unspecified: Secondary | ICD-10-CM
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
## 2021-09-19 NOTE — Patient Instructions (Addendum)
Deep Effective Breath ? ? ?Standing, sitting, or laying down, place both hands on the belly. Take a deep breath IN, expanding the belly; then breath OUT, contracting the belly. ?Repeat __5__ times. Do __2-3__ sessions per day and before your self massage. ? ?http://gt2.exer.us/866  ? ?Copyright ? VHI. All rights reserved.  ?Copyright ? VHI. All rights reserved.  ?Axilla to Inguinal Nodes - Sweep ? ? ?On involved side, make 5 circles at groin at panty line, then pump _5__ times from armpit along side of trunk to outer hip, making your other pathway. ?Do __1_ time per day. ? ?Copyright ? VHI. All rights reserved.  ?Arm Posterior: Elbow to Shoulder - Sweep ? ? ?Pump _5__ times from back of elbow to top of shoulder. Then inner to outer upper arm _5_ times, then outer arm again _5_ times. Then back to the pathways _2-3_ times. ?Do _1__ time per day. ? ?Copyright ? VHI. All rights reserved.  ?ARM: [place] Wrist to Elbow - Sweep ? ? ?Pump or stationary circles _5__ times from wrist to elbow making sure to do both sides of the forearm. Then retrace your steps to the outer arm, and the pathways _2-3_ times each. ?Do _1__ time per day. ? ?Copyright ? VHI. All rights reserved.  ?ARM: Dorsum of Hand to Shoulder - Sweep ? ? ?Pump or stationary circles _5__ times on back of hand including knuckle spaces and individual fingers if needed working up towards the wrist, then retrace all your steps working back up the forearm, doing both sides; upper outer arm and back to your pathways _2-3_ times each. Then do 5 circles again at uninvolved armpit and involved groin where you started! Good job!! ?Do __1_ time per day. ? ?Copyright ? VHI. All rights reserved.  ? ? ?  ?

## 2021-09-21 ENCOUNTER — Ambulatory Visit: Payer: No Typology Code available for payment source

## 2021-09-21 DIAGNOSIS — R600 Localized edema: Secondary | ICD-10-CM

## 2021-09-21 DIAGNOSIS — M25612 Stiffness of left shoulder, not elsewhere classified: Secondary | ICD-10-CM

## 2021-09-21 DIAGNOSIS — R293 Abnormal posture: Secondary | ICD-10-CM

## 2021-09-21 DIAGNOSIS — M25611 Stiffness of right shoulder, not elsewhere classified: Secondary | ICD-10-CM

## 2021-09-21 DIAGNOSIS — L599 Disorder of the skin and subcutaneous tissue related to radiation, unspecified: Secondary | ICD-10-CM

## 2021-09-21 DIAGNOSIS — Z170 Estrogen receptor positive status [ER+]: Secondary | ICD-10-CM
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
## 2021-09-22 NOTE — Progress Notes (Signed)
                                                                                                                                                             Patient Name: Sierra Newman MRN: [id number] DOB: 1970/12/19 Referring Physician: Nicholas Lose (Profile Not Attached) Date of Service: 09/01/2021 [facility], [place]                                                        End Of Treatment Note  Diagnoses: C50.412-Malignant neoplasm of upper-outer quadrant of left female breast  Cancer Staging: Stage IA, pT1cN1aM0, grade 1-2 invasive ductal carcinoma of the left breast with Synchronous Stage IA , pT1aN0M0, grade 1-2 invasive ductal carcinoma of the right breast.  Intent: Curative  Radiation Treatment Dates: 07/18/2021 through 09/01/2021 Site Technique Total Dose (Gy) Dose per Fx (Gy) Completed Fx Beam Energies  Chest Wall, Left: CW_L 3D 50.4/50.4 1.8 28/28 6X  Chest Wall, Left: CW_L_SCLV 3D 50.4/50.4 1.8 28/28 6X, 10X  Chest Wall, Left: CW_L_Bst Electron 10/10 2 5/5 6E   Narrative: The patient tolerated radiation therapy relatively well. She developed fatigue and anticipated skin changes in the treatment field. She did have some dry desquamation in the upper axilla as well toward the conclusion of therapy.  Plan: The patient will receive a call in about one month from the radiation oncology department. She will continue follow up with Dr. Lindi Adie as well.   ________________________________________________    Carola Rhine, [facility]

## 2021-09-26 ENCOUNTER — Other Ambulatory Visit: Payer: Self-pay | Admitting: Family Medicine

## 2021-09-26 ENCOUNTER — Other Ambulatory Visit (HOSPITAL_COMMUNITY): Admission: RE | Admit: 2021-09-26 | Payer: Self-pay | Source: Ambulatory Visit

## 2021-09-27 ENCOUNTER — Ambulatory Visit: Payer: No Typology Code available for payment source

## 2021-09-27 DIAGNOSIS — M25611 Stiffness of right shoulder, not elsewhere classified: Secondary | ICD-10-CM

## 2021-09-27 DIAGNOSIS — M25612 Stiffness of left shoulder, not elsewhere classified: Secondary | ICD-10-CM

## 2021-09-27 DIAGNOSIS — R600 Localized edema: Secondary | ICD-10-CM

## 2021-09-27 DIAGNOSIS — Z170 Estrogen receptor positive status [ER+]: Secondary | ICD-10-CM

## 2021-09-27 DIAGNOSIS — R293 Abnormal posture: Secondary | ICD-10-CM

## 2021-09-27 DIAGNOSIS — L599 Disorder of the skin and subcutaneous tissue related to radiation, unspecified: Secondary | ICD-10-CM
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
## 2021-09-29 LAB — CYTOLOGY - PAP
Adequacy: ABNORMAL
Comment: NEGATIVE

## 2021-09-30 ENCOUNTER — Ambulatory Visit: Payer: No Typology Code available for payment source

## 2021-09-30 DIAGNOSIS — L599 Disorder of the skin and subcutaneous tissue related to radiation, unspecified: Secondary | ICD-10-CM

## 2021-09-30 DIAGNOSIS — Z170 Estrogen receptor positive status [ER+]: Secondary | ICD-10-CM

## 2021-09-30 DIAGNOSIS — M25611 Stiffness of right shoulder, not elsewhere classified: Secondary | ICD-10-CM

## 2021-09-30 DIAGNOSIS — R600 Localized edema: Secondary | ICD-10-CM

## 2021-09-30 DIAGNOSIS — R293 Abnormal posture: Secondary | ICD-10-CM

## 2021-09-30 DIAGNOSIS — M25612 Stiffness of left shoulder, not elsewhere classified: Secondary | ICD-10-CM
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
## 2021-10-04 ENCOUNTER — Encounter: Payer: Self-pay | Admitting: Radiation Oncology

## 2021-10-04 ENCOUNTER — Ambulatory Visit
Admission: RE | Admit: 2021-10-04 | Discharge: 2021-10-04 | Disposition: A | Discharge status: Home / Self Care | Payer: No Typology Code available for payment source | Source: Ambulatory Visit | Attending: Hematology and Oncology | Admitting: Hematology and Oncology

## 2021-10-04 ENCOUNTER — Ambulatory Visit: Payer: No Typology Code available for payment source

## 2021-10-04 DIAGNOSIS — C50511 Malignant neoplasm of lower-outer quadrant of right female breast: Secondary | ICD-10-CM

## 2021-10-04 DIAGNOSIS — R293 Abnormal posture: Secondary | ICD-10-CM

## 2021-10-04 DIAGNOSIS — Z170 Estrogen receptor positive status [ER+]: Secondary | ICD-10-CM

## 2021-10-04 DIAGNOSIS — L599 Disorder of the skin and subcutaneous tissue related to radiation, unspecified: Secondary | ICD-10-CM

## 2021-10-04 DIAGNOSIS — C50412 Malignant neoplasm of upper-outer quadrant of left female breast: Secondary | ICD-10-CM

## 2021-10-04 DIAGNOSIS — R600 Localized edema: Secondary | ICD-10-CM

## 2021-10-04 DIAGNOSIS — M25612 Stiffness of left shoulder, not elsewhere classified: Secondary | ICD-10-CM

## 2021-10-04 DIAGNOSIS — M25611 Stiffness of right shoulder, not elsewhere classified: Secondary | ICD-10-CM

## 2021-10-04 NOTE — Progress Notes (Signed)
  Radiation Oncology         [phone] ________________________________  Name: Sierra Newman MRN: [id number]  Date of Service: 10/04/2021  DOB: [birth year]  Post Treatment Telephone Note  Diagnosis:   Stage IA, pT1cN1aM0, grade 1-2 invasive ductal carcinoma of the left breast with Synchronous Stage IA , pT1aN0M0, grade 1-2 invasive ductal carcinoma of the right breast.  Intent: Curative  Radiation Treatment Dates: 07/18/2021 through 09/01/2021 Site Technique Total Dose (Gy) Dose per Fx (Gy) Completed Fx Beam Energies  Chest Wall, Left: CW_L 3D 50.4/50.4 1.8 28/28 6X  Chest Wall, Left: CW_L_SCLV 3D 50.4/50.4 1.8 28/28 6X, 10X  Chest Wall, Left: CW_L_Bst Electron 10/10 2 5/5 6E   Narrative: The patient tolerated radiation therapy relatively well. She developed fatigue and anticipated skin changes in the treatment field. She did have some dry desquamation in the upper axilla as well toward the conclusion of therapy.     Impression/Plan: 1. Stage IA, pT1cN1aM0, grade 1-2 invasive ductal carcinoma of the left breast with Synchronous Stage IA , pT1aN0M0, grade 1-2 invasive ductal carcinoma of the right breast. The patient contacted our team by phone to cancel this appt but I'll reach out to her via MyChart to check in. We would be happy to continue to follow her as needed, but she will also continue to follow up with Dr. Lindi Adie in medical oncology.        Carola Rhine, PAC
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
## 2021-10-06 ENCOUNTER — Ambulatory Visit: Payer: No Typology Code available for payment source

## 2021-10-07 ENCOUNTER — Other Ambulatory Visit: Payer: Self-pay | Admitting: Family Medicine

## 2021-10-07 ENCOUNTER — Other Ambulatory Visit (HOSPITAL_COMMUNITY)
Admission: RE | Admit: 2021-10-07 | Discharge: 2021-10-07 | Disposition: A | Discharge status: Home / Self Care | Payer: Self-pay | Source: Ambulatory Visit | Attending: Family Medicine | Admitting: Family Medicine

## 2021-10-07 DIAGNOSIS — Z01411 Encounter for gynecological examination (general) (routine) with abnormal findings: Secondary | ICD-10-CM | POA: Insufficient documentation

## 2021-10-11 LAB — CYTOLOGY - PAP: Diagnosis: NEGATIVE

## 2021-10-17 ENCOUNTER — Ambulatory Visit: Payer: No Typology Code available for payment source | Admitting: Physical Therapy

## 2021-10-18 ENCOUNTER — Ambulatory Visit: Payer: No Typology Code available for payment source | Attending: General Surgery

## 2021-10-18 DIAGNOSIS — M25612 Stiffness of left shoulder, not elsewhere classified: Secondary | ICD-10-CM | POA: Insufficient documentation

## 2021-10-18 DIAGNOSIS — Z170 Estrogen receptor positive status [ER+]: Secondary | ICD-10-CM | POA: Insufficient documentation

## 2021-10-18 DIAGNOSIS — M25611 Stiffness of right shoulder, not elsewhere classified: Secondary | ICD-10-CM | POA: Insufficient documentation

## 2021-10-18 DIAGNOSIS — C50412 Malignant neoplasm of upper-outer quadrant of left female breast: Secondary | ICD-10-CM | POA: Insufficient documentation

## 2021-10-18 DIAGNOSIS — R293 Abnormal posture: Secondary | ICD-10-CM | POA: Insufficient documentation

## 2021-10-18 DIAGNOSIS — L599 Disorder of the skin and subcutaneous tissue related to radiation, unspecified: Secondary | ICD-10-CM | POA: Insufficient documentation

## 2021-10-18 DIAGNOSIS — R600 Localized edema: Secondary | ICD-10-CM | POA: Insufficient documentation
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
## 2021-10-28 ENCOUNTER — Telehealth: Payer: Self-pay | Admitting: *Deleted

## 2021-11-01 ENCOUNTER — Inpatient Hospital Stay: Payer: Self-pay | Attending: Genetic Counselor | Admitting: Adult Health

## 2021-11-01 ENCOUNTER — Other Ambulatory Visit: Payer: Self-pay

## 2021-11-01 ENCOUNTER — Encounter: Payer: Self-pay | Admitting: Adult Health

## 2021-11-01 VITALS — BP 128/77 | HR 84 | Temp 97.7°F | Resp 18 | Wt 136.3 lb

## 2021-11-01 DIAGNOSIS — C50412 Malignant neoplasm of upper-outer quadrant of left female breast: Secondary | ICD-10-CM | POA: Insufficient documentation

## 2021-11-01 DIAGNOSIS — Z79811 Long term (current) use of aromatase inhibitors: Secondary | ICD-10-CM | POA: Insufficient documentation

## 2021-11-01 DIAGNOSIS — R232 Flushing: Secondary | ICD-10-CM | POA: Insufficient documentation

## 2021-11-01 DIAGNOSIS — Z801 Family history of malignant neoplasm of trachea, bronchus and lung: Secondary | ICD-10-CM | POA: Insufficient documentation

## 2021-11-01 DIAGNOSIS — Z9013 Acquired absence of bilateral breasts and nipples: Secondary | ICD-10-CM | POA: Insufficient documentation

## 2021-11-01 DIAGNOSIS — Z800 Family history of malignant neoplasm of digestive organs: Secondary | ICD-10-CM | POA: Insufficient documentation

## 2021-11-01 DIAGNOSIS — C50511 Malignant neoplasm of lower-outer quadrant of right female breast: Secondary | ICD-10-CM | POA: Insufficient documentation

## 2021-11-01 DIAGNOSIS — Z79899 Other long term (current) drug therapy: Secondary | ICD-10-CM | POA: Insufficient documentation

## 2021-11-01 DIAGNOSIS — Z170 Estrogen receptor positive status [ER+]: Secondary | ICD-10-CM | POA: Insufficient documentation
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
## 2021-11-02 ENCOUNTER — Telehealth: Payer: Self-pay | Admitting: Adult Health

## 2021-11-02 NOTE — Telephone Encounter (Signed)
Scheduled appointment per 6/27 los. Patient is aware. 

## 2021-11-15 ENCOUNTER — Encounter: Payer: Self-pay | Admitting: Physical Therapy

## 2021-11-15 ENCOUNTER — Ambulatory Visit: Payer: No Typology Code available for payment source | Attending: General Surgery | Admitting: Physical Therapy

## 2021-11-15 DIAGNOSIS — Z170 Estrogen receptor positive status [ER+]: Secondary | ICD-10-CM | POA: Insufficient documentation

## 2021-11-15 DIAGNOSIS — R600 Localized edema: Secondary | ICD-10-CM | POA: Insufficient documentation

## 2021-11-15 DIAGNOSIS — M25612 Stiffness of left shoulder, not elsewhere classified: Secondary | ICD-10-CM | POA: Insufficient documentation

## 2021-11-15 DIAGNOSIS — M25611 Stiffness of right shoulder, not elsewhere classified: Secondary | ICD-10-CM | POA: Insufficient documentation

## 2021-11-15 DIAGNOSIS — L599 Disorder of the skin and subcutaneous tissue related to radiation, unspecified: Secondary | ICD-10-CM | POA: Insufficient documentation

## 2021-11-15 DIAGNOSIS — C50412 Malignant neoplasm of upper-outer quadrant of left female breast: Secondary | ICD-10-CM | POA: Insufficient documentation

## 2021-11-15 DIAGNOSIS — R293 Abnormal posture: Secondary | ICD-10-CM | POA: Insufficient documentation
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
## 2021-11-29 ENCOUNTER — Ambulatory Visit: Payer: No Typology Code available for payment source | Admitting: Physical Therapy

## 2021-11-29 ENCOUNTER — Encounter: Payer: Self-pay | Admitting: Physical Therapy

## 2021-11-29 DIAGNOSIS — M25611 Stiffness of right shoulder, not elsewhere classified: Secondary | ICD-10-CM

## 2021-11-29 DIAGNOSIS — L599 Disorder of the skin and subcutaneous tissue related to radiation, unspecified: Secondary | ICD-10-CM

## 2021-11-29 DIAGNOSIS — Z170 Estrogen receptor positive status [ER+]: Secondary | ICD-10-CM

## 2021-11-29 DIAGNOSIS — R600 Localized edema: Secondary | ICD-10-CM

## 2021-11-29 DIAGNOSIS — R293 Abnormal posture: Secondary | ICD-10-CM

## 2021-11-29 DIAGNOSIS — M25612 Stiffness of left shoulder, not elsewhere classified: Secondary | ICD-10-CM
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
## 2021-12-13 ENCOUNTER — Ambulatory Visit: Payer: No Typology Code available for payment source | Admitting: Physical Therapy

## 2021-12-16 ENCOUNTER — Encounter: Payer: Self-pay | Admitting: *Deleted

## 2021-12-19 ENCOUNTER — Telehealth: Payer: Self-pay | Admitting: Licensed Clinical Social Worker

## 2021-12-19 ENCOUNTER — Ambulatory Visit: Payer: No Typology Code available for payment source

## 2021-12-19 NOTE — Telephone Encounter (Signed)
[facility] Work  Clinical Social Work was referred by Marine scientist for counseling.  Clinical Social Worker attempted to contact patient by phone  to offer support and assess for needs.  No answer. Left VM with direct contact information.      [place], [place] Worker [employer]

## 2021-12-19 NOTE — Addendum Note (Signed)
Addended by: [employer]a [employer] D on: 12/19/2021 09:21 AM   Modules accepted: Orders

## 2021-12-20 ENCOUNTER — Inpatient Hospital Stay
Payer: No Typology Code available for payment source | Attending: Genetic Counselor | Admitting: Licensed Clinical Social Worker

## 2021-12-20 ENCOUNTER — Ambulatory Visit: Payer: No Typology Code available for payment source | Attending: General Surgery

## 2021-12-20 DIAGNOSIS — Z170 Estrogen receptor positive status [ER+]: Secondary | ICD-10-CM | POA: Insufficient documentation

## 2021-12-20 DIAGNOSIS — R600 Localized edema: Secondary | ICD-10-CM | POA: Insufficient documentation

## 2021-12-20 DIAGNOSIS — C50412 Malignant neoplasm of upper-outer quadrant of left female breast: Secondary | ICD-10-CM | POA: Insufficient documentation

## 2021-12-20 DIAGNOSIS — R293 Abnormal posture: Secondary | ICD-10-CM | POA: Insufficient documentation

## 2021-12-20 DIAGNOSIS — L599 Disorder of the skin and subcutaneous tissue related to radiation, unspecified: Secondary | ICD-10-CM | POA: Insufficient documentation

## 2021-12-20 DIAGNOSIS — M25612 Stiffness of left shoulder, not elsewhere classified: Secondary | ICD-10-CM | POA: Insufficient documentation

## 2021-12-20 DIAGNOSIS — M25611 Stiffness of right shoulder, not elsewhere classified: Secondary | ICD-10-CM | POA: Insufficient documentation

## 2021-12-20 NOTE — Progress Notes (Signed)
[facility] Work  CSW received return call from patient who is interested in counseling and support. She is in survivorship but now beginning to process diagnosis and what she went through with treatment. CSW reviewed all counseling options, peer mentor program, support group, and FYNN class. E-mailed information to pt per her request. She will review and decide which she would like to pursue.   Blondie Riggsbee E Cecylia Brazill, LCSW
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
## 2022-01-03 ENCOUNTER — Inpatient Hospital Stay: Payer: No Typology Code available for payment source

## 2022-01-04 NOTE — Progress Notes (Signed)
Completed 60-minute phone visit with patient.  Patient unable to access video and audio for a video visit.    Goals: Assess concerns related to post-treatment survivorship  Interventions:  1) Assessed current concerns related to seeking counseling, 2) Provided person-centered therapy to assist with emotional concerns related to post-treatment survivorship, 3) Provided cognitive-behavioral interventions related to coping with changes in life post-treatment, 4) Provided psycho-education related to the developmental psychological process associated with treatment and recovery from cancer.   Follow-up appointment in two weeks.

## 2022-01-05 NOTE — H&P (Signed)
Subjective:     Patient ID: Sierra Newman is a 51 y.o. female.   HPI    7 months post op bilateral mastectomies with immediate reconstruction. Scheduled for implant exchange next month.   Presented with palpable left breast mass. MMG showed right breast calcs, a 3.4 cm distortion in the left breast UOQ. US showed a 2.5 cm left breast mass, normal axilla. Biopsy left breast showed IDC with DCIS , ER/PR+, HER2-.   MRI showed a 3.0 x 2.0 x 1.8 cm mass in the left breast consistent with the patient's biopsy-proven malignancy. A 1.1 cm mass in the right LOQ abutting the chest wall noted. Second-look [place] right breast with biopsy labeled 8 o clock showing IDC, ER/PR+, Her2-.    Final pathology left breast 1.5 cm IDC/DCIS, 1/3 LN+, right breast 0.5 cm IDC/DCIS, 0/3 SLN. Mammaprint low risk. Completed adjuvant RT 4.27.23. On Femara.   Genetics negative.    History prior right breast radial scar excision.   Prior 34 B, happy with this. No weight recorded on mastectomy specimens.   Lives with spouse and son.   Review of Systems  Remainder 12 point review negative    Objective:   Physical Exam Cardiovascular:     Rate and Rhythm: Normal rate and regular rhythm.     Heart sounds: Normal heart sounds.  Pulmonary:     Effort: Pulmonary effort is normal.     Breath sounds: Normal breath sounds.     Chest bilateral chest expanded Left chest hyperpigmentation present and firmer more upper pole fullness SN to nipple R 20 L 20 cm Nipple to IMF R 7 L 7 cm Some rippling noted on right     Assessment:     Left breast cancer UOQ ER+, right breast cancer LOQ ER+ S/p bilateral NSM, SLN, bilateral prepectoral TE/ADM (Alloderm) reconstruction Adjuvant radiation    Plan:       Reviewed RT significantly increases risk reconstruction including wound healing problems capsular contracture. Counseled encouraging data from prepectoral position regarding this, however some type of autologous tissue as  part of reconstruction may reduce risks. Reviewed timing of implant exchange approximately 3-6 months from end of radiation.  At that time could do implant exchange alone, implant exchange with LD flap for radiated chest, or coversion to autologous. Counseled I anticipate asymmetry breasts given RT especially in implant based setting and she is seeing that presently with her expanders.   Discussed saline vs silicone, smooth vs textured, shaped vs round. As in prepectoral position recommend capacity filled or HCG implants to minimize visible rippling. Reviewed risks BIA ALCL with textured devices and new reports of SCC related to implants in general. Reviewed risks rupture contracture. Reviewed implants are not permanent devices and may require additional surgery related to them. Patient has elected for silicone plan smooth round.   Reviewed any type implant has risk of rippling. Reviewed examples HCG and Soft Touch implants. Counseled silicone may offer less rippling over saline. Reviewed reoperation in setting implant reconstruction common including rupture CC asymmetry unacceptable cosmetic result. Previously reviewed portfolio pictures of radiated implant reconstruction and LD implant reconstruction. Reviewed purely autologous may offer most symmetry out of clothes. Patient states she notes present asymmetry and would be ok with this. Reviewed purpose LD flap to decrease risks such as wound healing problems (extrusion/exposure in implant reconstruction) and CC risk in setting radiation, counseled would still expect asymmetry result. Counseled with or without LD flap would expect lifetime need for PT post  RT. Patient declines LD flap or referral to microsurgeon.   Additional risks including but not limited to bleeding seroma hematoma infection unacceptable cosmetic result damage to adjacent structures blood clots in legs or lungs reviewed.    Reviewed OP surgery no drains anticipated.    Completed  Natrel[facility] physician patient checklist.   Rx for oxycodone bactrim and robaxin given.   Natrelle 133S FV-11-T 300 ml tissue expanders placed bilateral.  Fill volume 275 ml saline bilateral.   

## 2022-01-10 ENCOUNTER — Telehealth: Payer: No Typology Code available for payment source

## 2022-01-13 ENCOUNTER — Other Ambulatory Visit: Payer: Self-pay

## 2022-01-13 ENCOUNTER — Encounter (HOSPITAL_BASED_OUTPATIENT_CLINIC_OR_DEPARTMENT_OTHER): Payer: Self-pay | Admitting: Plastic Surgery

## 2022-01-16 NOTE — Progress Notes (Signed)

## 2022-01-17 ENCOUNTER — Inpatient Hospital Stay: Payer: Self-pay | Attending: Genetic Counselor

## 2022-01-18 NOTE — Progress Notes (Signed)
Completed 60-minute video visit with patient.   Goals: Assess concerns related to post-treatment survivorship  Interventions:  1) Assessed current concerns related to seeking counseling, 2) Provided person-centered therapy to assist with emotional concerns related to post-treatment survivorship, 3) Provided cognitive-behavioral interventions related to coping with changes in life post-treatment, 4) Provided psycho-education related to the developmental psychological process associated with treatment and recovery from cancer.    Follow-up appointment in two weeks.

## 2022-01-20 ENCOUNTER — Encounter (HOSPITAL_BASED_OUTPATIENT_CLINIC_OR_DEPARTMENT_OTHER): Payer: Self-pay | Admitting: Plastic Surgery

## 2022-01-20 ENCOUNTER — Other Ambulatory Visit: Payer: Self-pay

## 2022-01-20 ENCOUNTER — Ambulatory Visit (HOSPITAL_BASED_OUTPATIENT_CLINIC_OR_DEPARTMENT_OTHER): Payer: No Typology Code available for payment source | Admitting: Anesthesiology

## 2022-01-20 ENCOUNTER — Encounter (HOSPITAL_BASED_OUTPATIENT_CLINIC_OR_DEPARTMENT_OTHER)
Admission: RE | Disposition: A | Discharge status: Home / Self Care | Payer: Self-pay | Source: Ambulatory Visit | Attending: Plastic Surgery

## 2022-01-20 ENCOUNTER — Ambulatory Visit (HOSPITAL_BASED_OUTPATIENT_CLINIC_OR_DEPARTMENT_OTHER)
Admission: RE | Admit: 2022-01-20 | Discharge: 2022-01-20 | Disposition: A | Discharge status: Home / Self Care | Payer: No Typology Code available for payment source | Source: Ambulatory Visit | Attending: Plastic Surgery | Admitting: Plastic Surgery

## 2022-01-20 DIAGNOSIS — Z01818 Encounter for other preprocedural examination: Secondary | ICD-10-CM

## 2022-01-20 DIAGNOSIS — Z9013 Acquired absence of bilateral breasts and nipples: Secondary | ICD-10-CM

## 2022-01-20 DIAGNOSIS — Z853 Personal history of malignant neoplasm of breast: Secondary | ICD-10-CM | POA: Insufficient documentation

## 2022-01-20 DIAGNOSIS — Z421 Encounter for breast reconstruction following mastectomy: Secondary | ICD-10-CM | POA: Insufficient documentation

## 2022-01-20 DIAGNOSIS — I10 Essential (primary) hypertension: Secondary | ICD-10-CM | POA: Insufficient documentation

## 2022-01-20 HISTORY — PX: REMOVAL OF BILATERAL TISSUE EXPANDERS WITH PLACEMENT OF BILATERAL BREAST IMPLANTS: SHX6431

## 2022-01-20 SURGERY — REMOVAL, TISSUE EXPANDER, BREAST, BILATERAL, WITH BILATERAL IMPLANT IMPLANT INSERTION
Anesthesia: General | Site: Chest | Laterality: Bilateral

## 2022-01-20 MED ORDER — OXYCODONE HCL 5 MG/5ML PO SOLN: 5.0000 mg | Freq: Once | ORAL | Status: AC | PRN

## 2022-01-20 MED ORDER — GABAPENTIN 300 MG PO CAPS
ORAL_CAPSULE | ORAL | Status: AC
  Filled 2022-01-20: qty 1

## 2022-01-20 MED ORDER — OXYCODONE HCL 5 MG PO TABS
ORAL_TABLET | ORAL | Status: AC
  Filled 2022-01-20: qty 1

## 2022-01-20 MED ORDER — BUPIVACAINE HCL (PF) 0.25 % IJ SOLN
INTRAMUSCULAR | Status: DC | PRN
  Administered 2022-01-20: 30 mL

## 2022-01-20 MED ORDER — MIDAZOLAM HCL 2 MG/2ML IJ SOLN
INTRAMUSCULAR | Status: AC
  Filled 2022-01-20: qty 2

## 2022-01-20 MED ORDER — CELECOXIB 200 MG PO CAPS
ORAL_CAPSULE | ORAL | Status: AC
  Filled 2022-01-20: qty 1

## 2022-01-20 MED ORDER — SUCCINYLCHOLINE CHLORIDE 200 MG/10ML IV SOSY
PREFILLED_SYRINGE | INTRAVENOUS | Status: AC
  Filled 2022-01-20: qty 10

## 2022-01-20 MED ORDER — DEXAMETHASONE SODIUM PHOSPHATE 10 MG/ML IJ SOLN
INTRAMUSCULAR | Status: AC
  Filled 2022-01-20: qty 1

## 2022-01-20 MED ORDER — PHENYLEPHRINE 80 MCG/ML (10ML) SYRINGE FOR IV PUSH (FOR BLOOD PRESSURE SUPPORT)
PREFILLED_SYRINGE | INTRAVENOUS | Status: AC
  Filled 2022-01-20: qty 10

## 2022-01-20 MED ORDER — LACTATED RINGERS IV SOLN
INTRAVENOUS | Status: DC
Start: 2022-01-20 — End: 2022-01-20

## 2022-01-20 MED ORDER — CEFAZOLIN SODIUM-DEXTROSE 2-4 GM/100ML-% IV SOLN
2.0000 g | INTRAVENOUS | Status: AC
  Administered 2022-01-20: 2 g via INTRAVENOUS

## 2022-01-20 MED ORDER — FENTANYL CITRATE (PF) 100 MCG/2ML IJ SOLN
INTRAMUSCULAR | Status: DC | PRN
  Administered 2022-01-20: 100 ug via INTRAVENOUS

## 2022-01-20 MED ORDER — EPHEDRINE 5 MG/ML INJ
INTRAVENOUS | Status: AC
  Filled 2022-01-20: qty 5

## 2022-01-20 MED ORDER — DEXAMETHASONE SODIUM PHOSPHATE 4 MG/ML IJ SOLN
INTRAMUSCULAR | Status: DC | PRN
  Administered 2022-01-20: 5 mg via INTRAVENOUS

## 2022-01-20 MED ORDER — CEFAZOLIN SODIUM-DEXTROSE 2-4 GM/100ML-% IV SOLN
INTRAVENOUS | Status: AC
  Filled 2022-01-20: qty 100

## 2022-01-20 MED ORDER — 0.9 % SODIUM CHLORIDE (POUR BTL) OPTIME
TOPICAL | Status: DC | PRN
  Administered 2022-01-20: 1000 mL

## 2022-01-20 MED ORDER — FENTANYL CITRATE (PF) 100 MCG/2ML IJ SOLN
INTRAMUSCULAR | Status: AC
  Filled 2022-01-20: qty 2

## 2022-01-20 MED ORDER — PHENYLEPHRINE HCL-NACL 20-0.9 MG/250ML-% IV SOLN
INTRAVENOUS | Status: DC | PRN
  Administered 2022-01-20: 40 ug/min via INTRAVENOUS

## 2022-01-20 MED ORDER — SCOPOLAMINE 1 MG/3DAYS TD PT72: 1.0000 | MEDICATED_PATCH | TRANSDERMAL | Status: DC

## 2022-01-20 MED ORDER — POVIDONE-IODINE 10 % EX SOLN
CUTANEOUS | Status: DC | PRN
  Administered 2022-01-20: 1 via TOPICAL

## 2022-01-20 MED ORDER — LIDOCAINE 2% (20 MG/ML) 5 ML SYRINGE
INTRAMUSCULAR | Status: AC
  Filled 2022-01-20: qty 5

## 2022-01-20 MED ORDER — PHENYLEPHRINE HCL (PRESSORS) 10 MG/ML IV SOLN
INTRAVENOUS | Status: DC | PRN
  Administered 2022-01-20 (×2): 80 ug via INTRAVENOUS

## 2022-01-20 MED ORDER — LIDOCAINE HCL (CARDIAC) PF 100 MG/5ML IV SOSY
PREFILLED_SYRINGE | INTRAVENOUS | Status: DC | PRN
  Administered 2022-01-20: 60 mg via INTRAVENOUS

## 2022-01-20 MED ORDER — ATROPINE SULFATE 0.4 MG/ML IV SOLN
INTRAVENOUS | Status: AC
  Filled 2022-01-20: qty 1

## 2022-01-20 MED ORDER — ACETAMINOPHEN 500 MG PO TABS
1000.0000 mg | ORAL_TABLET | ORAL | Status: AC
  Administered 2022-01-20: 1000 mg via ORAL

## 2022-01-20 MED ORDER — BUPIVACAINE HCL (PF) 0.25 % IJ SOLN
INTRAMUSCULAR | Status: AC
  Filled 2022-01-20: qty 60

## 2022-01-20 MED ORDER — MEPERIDINE HCL 25 MG/ML IJ SOLN: 6.2500 mg | INTRAMUSCULAR | Status: DC | PRN

## 2022-01-20 MED ORDER — CELECOXIB 200 MG PO CAPS
200.0000 mg | ORAL_CAPSULE | ORAL | Status: AC
  Administered 2022-01-20: 200 mg via ORAL

## 2022-01-20 MED ORDER — MIDAZOLAM HCL 2 MG/2ML IJ SOLN: 0.5000 mg | Freq: Once | INTRAMUSCULAR | Status: DC | PRN

## 2022-01-20 MED ORDER — HYDROMORPHONE HCL 1 MG/ML IJ SOLN: 0.2500 mg | INTRAMUSCULAR | Status: DC | PRN

## 2022-01-20 MED ORDER — SODIUM CHLORIDE 0.9 % IV SOLN
INTRAVENOUS | Status: DC | PRN
  Administered 2022-01-20: 500 mL

## 2022-01-20 MED ORDER — CHLORHEXIDINE GLUCONATE CLOTH 2 % EX PADS: 6.0000 | MEDICATED_PAD | Freq: Once | CUTANEOUS | Status: DC

## 2022-01-20 MED ORDER — ACETAMINOPHEN 500 MG PO TABS
ORAL_TABLET | ORAL | Status: AC
  Filled 2022-01-20: qty 2

## 2022-01-20 MED ORDER — MIDAZOLAM HCL 5 MG/5ML IJ SOLN
INTRAMUSCULAR | Status: DC | PRN
  Administered 2022-01-20: 2 mg via INTRAVENOUS

## 2022-01-20 MED ORDER — PROMETHAZINE HCL 25 MG/ML IJ SOLN: 6.2500 mg | INTRAMUSCULAR | Status: DC | PRN

## 2022-01-20 MED ORDER — OXYCODONE HCL 5 MG PO TABS
5.0000 mg | ORAL_TABLET | Freq: Once | ORAL | Status: AC | PRN
  Administered 2022-01-20: 5 mg via ORAL

## 2022-01-20 MED ORDER — GABAPENTIN 300 MG PO CAPS
300.0000 mg | ORAL_CAPSULE | ORAL | Status: AC
  Administered 2022-01-20: 300 mg via ORAL

## 2022-01-20 MED ORDER — SODIUM CHLORIDE 0.9 % IV SOLN
INTRAVENOUS | Status: AC
  Filled 2022-01-20: qty 10

## 2022-01-20 MED ORDER — ACETAMINOPHEN 500 MG PO TABS: 1000.0000 mg | ORAL_TABLET | Freq: Once | ORAL | Status: DC

## 2022-01-20 MED ORDER — EPHEDRINE SULFATE (PRESSORS) 50 MG/ML IJ SOLN
INTRAMUSCULAR | Status: DC | PRN
  Administered 2022-01-20: 5 mg via INTRAVENOUS
  Administered 2022-01-20 (×2): 10 mg via INTRAVENOUS

## 2022-01-20 MED ORDER — ONDANSETRON HCL 4 MG/2ML IJ SOLN
INTRAMUSCULAR | Status: AC
  Filled 2022-01-20: qty 2

## 2022-01-20 MED ORDER — PROPOFOL 10 MG/ML IV BOLUS
INTRAVENOUS | Status: DC | PRN
  Administered 2022-01-20: 200 mg via INTRAVENOUS

## 2022-01-20 SURGICAL SUPPLY — 73 items
ADH SKN CLS APL DERMABOND .7 (GAUZE/BANDAGES/DRESSINGS) ×2
APL PRP STRL LF DISP 70% ISPRP (MISCELLANEOUS) ×2
BAG DECANTER FOR FLEXI CONT (MISCELLANEOUS) ×1 IMPLANT
BINDER BREAST 3XL (GAUZE/BANDAGES/DRESSINGS) IMPLANT
BINDER BREAST LRG (GAUZE/BANDAGES/DRESSINGS) IMPLANT
BINDER BREAST MEDIUM (GAUZE/BANDAGES/DRESSINGS) IMPLANT
BINDER BREAST XLRG (GAUZE/BANDAGES/DRESSINGS) IMPLANT
BINDER BREAST XXLRG (GAUZE/BANDAGES/DRESSINGS) IMPLANT
BLADE SURG 10 STRL SS (BLADE) ×2 IMPLANT
BNDG GAUZE DERMACEA FLUFF 4 (GAUZE/BANDAGES/DRESSINGS) ×2 IMPLANT
BNDG GZE DERMACEA 4 6PLY (GAUZE/BANDAGES/DRESSINGS) ×2
CANISTER SUCT 1200ML W/VALVE (MISCELLANEOUS) ×1 IMPLANT
CHLORAPREP W/TINT 26 (MISCELLANEOUS) ×2 IMPLANT
COVER BACK TABLE 60X90IN (DRAPES) ×1 IMPLANT
COVER MAYO STAND STRL (DRAPES) ×1 IMPLANT
DERMABOND ADVANCED .7 DNX12 (GAUZE/BANDAGES/DRESSINGS) ×2 IMPLANT
DRAIN CHANNEL 15F RND FF W/TCR (WOUND CARE) IMPLANT
DRAPE TOP ARMCOVERS (MISCELLANEOUS) ×1 IMPLANT
DRAPE U-SHAPE 76X120 STRL (DRAPES) ×1 IMPLANT
DRAPE UTILITY XL STRL (DRAPES) ×1 IMPLANT
ELECT BLADE 4.0 EZ CLEAN MEGAD (MISCELLANEOUS)
ELECT COATED BLADE 2.86 ST (ELECTRODE) ×1 IMPLANT
ELECT REM PT RETURN 9FT ADLT (ELECTROSURGICAL) ×1
ELECTRODE BLDE 4.0 EZ CLN MEGD (MISCELLANEOUS) IMPLANT
ELECTRODE REM PT RTRN 9FT ADLT (ELECTROSURGICAL) ×1 IMPLANT
EVACUATOR SILICONE 100CC (DRAIN) IMPLANT
GAUZE PAD ABD 8X10 STRL (GAUZE/BANDAGES/DRESSINGS) ×2 IMPLANT
GLOVE BIO SURGEON STRL SZ 6 (GLOVE) ×2 IMPLANT
GOWN STRL REUS W/ TWL LRG LVL3 (GOWN DISPOSABLE) ×2 IMPLANT
GOWN STRL REUS W/TWL LRG LVL3 (GOWN DISPOSABLE) ×2
IMPL BREAST P5.4XRND XFULL 340 (Breast) IMPLANT
IMPL BREAST P5.6XRND XFULL 375 (Breast) IMPLANT
IMPL BRST P5.4XRND XFULL 340CC (Breast) ×1 IMPLANT
IMPL BRST P5.6XRND XFULL 375CC (Breast) ×1 IMPLANT
IMPLANT BREAST GEL 340CC (Breast) ×1 IMPLANT
IMPLANT BREAST GEL 375CC (Breast) ×1 IMPLANT
KIT FILL ASEPTIC TRANSFER (MISCELLANEOUS) IMPLANT
MARKER SKIN DUAL TIP RULER LAB (MISCELLANEOUS) IMPLANT
NDL FILTER BLUNT 18X1 1/2 (NEEDLE) IMPLANT
NDL HYPO 25X1 1.5 SAFETY (NEEDLE) IMPLANT
NEEDLE FILTER BLUNT 18X1 1/2 (NEEDLE) IMPLANT
NEEDLE HYPO 25X1 1.5 SAFETY (NEEDLE) IMPLANT
PACK BASIN DAY SURGERY FS (CUSTOM PROCEDURE TRAY) ×1 IMPLANT
PENCIL SMOKE EVACUATOR (MISCELLANEOUS) ×1 IMPLANT
PIN SAFETY STERILE (MISCELLANEOUS) IMPLANT
SHEET MEDIUM DRAPE 40X70 STRL (DRAPES) ×1 IMPLANT
SIZER BREAST 335CC (SIZER) ×1
SIZER BREAST 340CC (SIZER) ×1
SIZER BREAST 345CC (SIZER) ×1
SIZER BREAST 375CC (SIZER) ×1
SIZER BRST 335CC (SIZER) IMPLANT
SIZER BRST 340CC (SIZER) IMPLANT
SIZER BRST STRL LF S 345CC (SIZER) IMPLANT
SIZER BRSTX STRL LF SIL 375CC (SIZER) IMPLANT
SLEEVE SCD COMPRESS KNEE MED (STOCKING) ×1 IMPLANT
SPIKE FLUID TRANSFER (MISCELLANEOUS) IMPLANT
SPONGE T-LAP 18X18 ~~LOC~~+RFID (SPONGE) ×1 IMPLANT
STAPLER VISISTAT 35W (STAPLE) ×1 IMPLANT
SUT ETHILON 2 0 FS 18 (SUTURE) IMPLANT
SUT MNCRL AB 4-0 PS2 18 (SUTURE) IMPLANT
SUT PDS AB 2-0 CT2 27 (SUTURE) IMPLANT
SUT VIC AB 3-0 PS1 18 (SUTURE)
SUT VIC AB 3-0 PS1 18XBRD (SUTURE) IMPLANT
SUT VIC AB 3-0 SH 27 (SUTURE) ×2
SUT VIC AB 3-0 SH 27X BRD (SUTURE) IMPLANT
SUT VICRYL 4-0 PS2 18IN ABS (SUTURE) IMPLANT
SYR 20ML LL LF (SYRINGE) IMPLANT
SYR BULB IRRIG 60ML STRL (SYRINGE) ×2 IMPLANT
SYR CONTROL 10ML LL (SYRINGE) IMPLANT
TOWEL GREEN STERILE FF (TOWEL DISPOSABLE) ×2 IMPLANT
TUBE CONNECTING 20X1/4 (TUBING) ×1 IMPLANT
UNDERPAD 30X36 HEAVY ABSORB (UNDERPADS AND DIAPERS) ×2 IMPLANT
YANKAUER SUCT BULB TIP NO VENT (SUCTIONS) ×1 IMPLANT

## 2022-01-20 NOTE — Op Note (Signed)
Operative Note   DATE OF OPERATION: 9.15.23  LOCATION: [facility]-outpatient  SURGICAL DIVISION: Plastic Surgery  PREOPERATIVE DIAGNOSES:  1. History breast cancer 2. Acquired absence breasts 3. History therapeutic radiation  POSTOPERATIVE DIAGNOSES:  same  PROCEDURE:  Removal bilateral chest tissue expanders and placement silicone implants  SURGEON: Irene Limbo MD MBA  ASSISTANT: none  ANESTHESIA:  General.   EBL: 15 ml  COMPLICATIONS: None immediate.   INDICATIONS FOR PROCEDURE:  The patient, Sierra Newman, is a 51 y.o. female born on 1971-03-29, is here for staged breast reconstruction following bilateral nipple sparing mastectomies with immediate prepectoral tissue expander acellular dermis reconstruction.   FINDINGS: Complete incorporation of ADM bilateral. Natrelle Soft Touch Extra Projection smooth roung implants placed bilateral. RIGHT 375 ml REF SSX-375 SN [id number] LEFT 340 ml REF SSX-340 [id number]  DESCRIPTION OF PROCEDURE:  The patient's operative site was marked with the patient in the preoperative area. The patient was taken to the operating room. SCDs were placed and IV antibiotics were given. The patient's operative site was prepped and draped in a sterile fashion. A time out was performed and all information was confirmed to be correct.  Incision made in bilateral inframammary fold scar and carried through superficial fascia and ADM. Expanders removed. Over left chest circumferential capsulotomies performed. Over left lower pole additional radial scoring capsule completed. Sizer placed in each cavity. Patient brought to upright sitting position and assessed for symmetry. Extra projection 340 ml implant selected for left chest and 375 ml implant selected for right chest placement. Patient returned to supine position.   Each cavity irrigated with saline solution containing Ancef gentamicin and Betadine. Hemostasis ensured. Local anesthetic infiltrated in  each chest. Implant placed in right chest cavity. Implant orientation ensured. Closure completed with 3-0 vicryl to approximated capsule and superficial fascia, 4-0 vicryl in dermis, and 4-0 monocryl subcuticular skin closure. Over left chest, implant placed. Implant orientation ensured. Closure completed with 3-0 vicryl to approximated capsule and superficial fascia, 4-0 vicryl in dermis, and 4-0 monocryl subcuticular skin closure. Dermabond applied to incisions followed by dry dressing, breast binder.   The patient was allowed to wake from anesthesia, extubated and taken to the recovery room in satisfactory condition.   SPECIMENS: none  DRAINS: none  Irene Limbo, MD [facility]  Office/ physician access line after hours [phone]

## 2022-01-20 NOTE — Anesthesia Procedure Notes (Signed)
Procedure Name: LMA Insertion Date/Time: 01/20/2022 9:00 AM  Performed by: Willa Frater, CRNAPre-anesthesia Checklist: Patient identified, Emergency Drugs available, Suction available and Patient being monitored Patient Re-evaluated:Patient Re-evaluated prior to induction Oxygen Delivery Method: Circle system utilized Preoxygenation: Pre-oxygenation with 100% oxygen Induction Type: IV induction Ventilation: Mask ventilation without difficulty LMA: LMA inserted LMA Size: 4.0 Number of attempts: 1 Airway Equipment and Method: Bite block Placement Confirmation: positive ETCO2 Tube secured with: Tape Dental Injury: Teeth and Oropharynx as per pre-operative assessment

## 2022-01-20 NOTE — Transfer of Care (Signed)
Immediate Anesthesia Transfer of Care Note  Patient: Sierra Newman  Procedure(s) Performed: REMOVAL OF BILATERAL TISSUE EXPANDERS WITH PLACEMENT OF BILATERAL BREAST IMPLANTS (Bilateral: Chest)  Patient Location: PACU  Anesthesia Type:General  Level of Consciousness: sedated  Airway & Oxygen Therapy: Patient Spontanous Breathing and Patient connected to face mask oxygen  Post-op Assessment: Report given to RN and Post -op Vital signs reviewed and stable  Post vital signs: Reviewed and stable  Last Vitals:  Vitals Value Taken Time  BP    Temp    Pulse    Resp    SpO2      Last Pain:  Vitals:   01/20/22 0712  TempSrc: Oral  PainSc: 0-No pain      Patients Stated Pain Goal: 7 (07/62/26 3335)  Complications: No notable events documented.

## 2022-01-20 NOTE — Anesthesia Preprocedure Evaluation (Addendum)
Anesthesia Evaluation  Patient identified by MRN, date of birth, ID band Patient awake    Reviewed: Allergy & Precautions, NPO status , Patient's Chart, lab work & pertinent test results  History of Anesthesia Complications Negative for: history of anesthetic complications  Airway Mallampati: III  TM Distance: >3 FB Neck ROM: Full    Dental  (+) Dental Advisory Given, Teeth Intact   Pulmonary neg pulmonary ROS,    breath sounds clear to auscultation       Cardiovascular hypertension, Pt. on medications (-) angina Rhythm:Regular Rate:Normal     Neuro/Psych Anxiety Depression negative neurological ROS     GI/Hepatic negative GI ROS, Neg liver ROS,   Endo/Other  negative endocrine ROS  Renal/GU negative Renal ROS     Musculoskeletal   Abdominal   Peds  Hematology negative hematology ROS (+)   Anesthesia Other Findings Breast cancer  Reproductive/Obstetrics                            Anesthesia Physical Anesthesia Plan  ASA: 2  Anesthesia Plan: General   Post-op Pain Management: Tylenol PO (pre-op)*   Induction: Intravenous  PONV Risk Score and Plan: 3 and Ondansetron, Dexamethasone and Scopolamine patch - Pre-op  Airway Management Planned: LMA  Additional Equipment: None  Intra-op Plan:   Post-operative Plan:   Informed Consent: I have reviewed the patients History and Physical, chart, labs and discussed the procedure including the risks, benefits and alternatives for the proposed anesthesia with the patient or authorized representative who has indicated his/her understanding and acceptance.     Dental advisory given  Plan Discussed with: CRNA and Surgeon  Anesthesia Plan Comments:       Anesthesia Quick Evaluation

## 2022-01-20 NOTE — Discharge Instructions (Signed)
May take Tylenol after 1:30pm, if needed.  May take NSAIDS (ibuprofen, motrin) after 1:30pm, if needed.     Post Anesthesia Home Care Instructions  Activity: Get plenty of rest for the remainder of the day. A responsible individual must stay with you for 24 hours following the procedure.  For the next 24 hours, DO NOT: -Drive a car -Operate machinery -Drink alcoholic beverages -Take any medication unless instructed by your physician -Make any legal decisions or sign important papers.  Meals: Start with liquid foods such as gelatin or soup. Progress to regular foods as tolerated. Avoid greasy, spicy, heavy foods. If nausea and/or vomiting occur, drink only clear liquids until the nausea and/or vomiting subsides. Call your physician if vomiting continues.  Special Instructions/Symptoms: Your throat may feel dry or sore from the anesthesia or the breathing tube placed in your throat during surgery. If this causes discomfort, gargle with warm salt water. The discomfort should disappear within 24 hours.  If you had a scopolamine patch placed behind your ear for the management of post- operative nausea and/or vomiting:  1. The medication in the patch is effective for 72 hours, after which it should be removed.  Wrap patch in a tissue and discard in the trash. Wash hands thoroughly with soap and water. 2. You may remove the patch earlier than 72 hours if you experience unpleasant side effects which may include dry mouth, dizziness or visual disturbances. 3. Avoid touching the patch. Wash your hands with soap and water after contact with the patch.    

## 2022-01-20 NOTE — Anesthesia Postprocedure Evaluation (Signed)
Anesthesia Post Note  Patient: Sierra Newman  Procedure(s) Performed: REMOVAL OF BILATERAL TISSUE EXPANDERS WITH PLACEMENT OF BILATERAL BREAST IMPLANTS (Bilateral: Chest)     Patient location during evaluation: Phase II Anesthesia Type: General Level of consciousness: awake and alert, patient cooperative and oriented Pain management: pain level controlled Vital Signs Assessment: post-procedure vital signs reviewed and stable Respiratory status: spontaneous breathing, nonlabored ventilation and respiratory function stable Cardiovascular status: blood pressure returned to baseline and stable Postop Assessment: no apparent nausea or vomiting, able to ambulate and adequate PO intake Anesthetic complications: no   No notable events documented.  Last Vitals:  Vitals:   01/20/22 1110 01/20/22 1159  BP: 130/77 131/80  Pulse:  81  Resp:  16  Temp:  (!) 36.2 C  SpO2:  97%    Last Pain:  Vitals:   01/20/22 1159  TempSrc: Oral  PainSc:                  Dravin Lance,E. Najib Colmenares

## 2022-01-20 NOTE — Interval H&P Note (Signed)
History and Physical Interval Note:  01/20/2022 7:01 AM  Sierra Newman  has presented today for surgery, with the diagnosis of history breast cancer, acquired absence breasts, history therapeutic radiation.  The various methods of treatment have been discussed with the patient and family. After consideration of risks, benefits and other options for treatment, the patient has consented to  Procedure(s): REMOVAL OF BILATERAL TISSUE EXPANDERS WITH PLACEMENT OF BILATERAL BREAST IMPLANTS (Bilateral) as a surgical intervention.  The patient's history has been reviewed, patient examined, no change in status, stable for surgery.  I have reviewed the patient's chart and labs.  Questions were answered to the patient's satisfaction.     Arnoldo Hooker Marit Goodwill

## 2022-01-23 ENCOUNTER — Encounter (HOSPITAL_BASED_OUTPATIENT_CLINIC_OR_DEPARTMENT_OTHER): Payer: Self-pay | Admitting: Plastic Surgery

## 2022-01-23 NOTE — Progress Notes (Signed)
Left message stating courtesy call and if any questions or concerns please call the doctors office.  

## 2022-01-31 ENCOUNTER — Inpatient Hospital Stay: Payer: No Typology Code available for payment source

## 2022-02-01 NOTE — Progress Notes (Signed)
Completed 60-minute video visit with patient.   Goals: Assess concerns related to post-treatment survivorship  Interventions:  1) Provided person-centered therapy to assist with emotional concerns related to post-treatment survivorship, 2) Provided cognitive-behavioral interventions related to coping with changes in life post-treatment, 3) Provided psycho-education related to the developmental psychological process associated with treatment and recovery from cancer.    Follow-up in one month on Oct. 24, 2023 at 9:00am.

## 2022-02-28 ENCOUNTER — Telehealth: Payer: No Typology Code available for payment source
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
## 2022-03-07 ENCOUNTER — Other Ambulatory Visit: Payer: Self-pay

## 2022-03-07 ENCOUNTER — Ambulatory Visit: Payer: Self-pay | Attending: Plastic Surgery

## 2022-03-07 DIAGNOSIS — R293 Abnormal posture: Secondary | ICD-10-CM | POA: Insufficient documentation

## 2022-03-07 DIAGNOSIS — M542 Cervicalgia: Secondary | ICD-10-CM | POA: Insufficient documentation

## 2022-03-07 DIAGNOSIS — Z9013 Acquired absence of bilateral breasts and nipples: Secondary | ICD-10-CM | POA: Insufficient documentation

## 2022-03-14 ENCOUNTER — Ambulatory Visit: Payer: No Typology Code available for payment source | Attending: Plastic Surgery

## 2022-03-14 DIAGNOSIS — M25612 Stiffness of left shoulder, not elsewhere classified: Secondary | ICD-10-CM | POA: Insufficient documentation

## 2022-03-14 DIAGNOSIS — M542 Cervicalgia: Secondary | ICD-10-CM | POA: Insufficient documentation

## 2022-03-14 DIAGNOSIS — R293 Abnormal posture: Secondary | ICD-10-CM | POA: Insufficient documentation

## 2022-03-14 DIAGNOSIS — M25611 Stiffness of right shoulder, not elsewhere classified: Secondary | ICD-10-CM | POA: Insufficient documentation

## 2022-03-14 DIAGNOSIS — Z9013 Acquired absence of bilateral breasts and nipples: Secondary | ICD-10-CM | POA: Insufficient documentation

## 2022-03-14 NOTE — Patient Instructions (Signed)
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
## 2022-03-15 ENCOUNTER — Ambulatory Visit: Payer: No Typology Code available for payment source

## 2022-03-20 ENCOUNTER — Ambulatory Visit: Payer: No Typology Code available for payment source

## 2022-03-20 DIAGNOSIS — R293 Abnormal posture: Secondary | ICD-10-CM

## 2022-03-20 DIAGNOSIS — M25611 Stiffness of right shoulder, not elsewhere classified: Secondary | ICD-10-CM

## 2022-03-20 DIAGNOSIS — M542 Cervicalgia: Secondary | ICD-10-CM
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
## 2022-03-23 ENCOUNTER — Ambulatory Visit: Payer: No Typology Code available for payment source | Admitting: Physical Therapy

## 2022-03-23 ENCOUNTER — Encounter: Payer: Self-pay | Admitting: Physical Therapy

## 2022-03-23 DIAGNOSIS — M542 Cervicalgia: Secondary | ICD-10-CM

## 2022-03-23 DIAGNOSIS — R293 Abnormal posture: Secondary | ICD-10-CM
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
## 2022-03-27 ENCOUNTER — Ambulatory Visit: Payer: No Typology Code available for payment source

## 2022-03-27 DIAGNOSIS — M25611 Stiffness of right shoulder, not elsewhere classified: Secondary | ICD-10-CM

## 2022-03-27 DIAGNOSIS — R293 Abnormal posture: Secondary | ICD-10-CM

## 2022-03-27 DIAGNOSIS — M25612 Stiffness of left shoulder, not elsewhere classified: Secondary | ICD-10-CM

## 2022-03-27 DIAGNOSIS — M542 Cervicalgia: Secondary | ICD-10-CM
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
## 2022-03-28 ENCOUNTER — Other Ambulatory Visit (HOSPITAL_COMMUNITY): Payer: Self-pay | Admitting: Family Medicine

## 2022-03-28 DIAGNOSIS — E782 Mixed hyperlipidemia: Secondary | ICD-10-CM

## 2022-03-29 ENCOUNTER — Ambulatory Visit: Payer: No Typology Code available for payment source

## 2022-03-29 DIAGNOSIS — M542 Cervicalgia: Secondary | ICD-10-CM

## 2022-03-29 DIAGNOSIS — R293 Abnormal posture: Secondary | ICD-10-CM
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
## 2022-04-04 ENCOUNTER — Encounter: Payer: Self-pay | Admitting: Rehabilitative and Restorative Service Providers"

## 2022-04-04 ENCOUNTER — Ambulatory Visit: Payer: No Typology Code available for payment source | Admitting: Rehabilitative and Restorative Service Providers"

## 2022-04-04 DIAGNOSIS — R293 Abnormal posture: Secondary | ICD-10-CM

## 2022-04-04 DIAGNOSIS — M542 Cervicalgia: Secondary | ICD-10-CM

## 2022-04-04 DIAGNOSIS — Z9013 Acquired absence of bilateral breasts and nipples: Secondary | ICD-10-CM
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
## 2022-04-06 ENCOUNTER — Ambulatory Visit (HOSPITAL_COMMUNITY)
Admission: RE | Admit: 2022-04-06 | Discharge: 2022-04-06 | Disposition: A | Discharge status: Home / Self Care | Payer: Self-pay | Source: Ambulatory Visit | Attending: Family Medicine | Admitting: Family Medicine

## 2022-04-06 ENCOUNTER — Ambulatory Visit: Payer: No Typology Code available for payment source

## 2022-04-06 DIAGNOSIS — E782 Mixed hyperlipidemia: Secondary | ICD-10-CM | POA: Insufficient documentation

## 2022-04-06 DIAGNOSIS — R293 Abnormal posture: Secondary | ICD-10-CM

## 2022-04-06 DIAGNOSIS — Z9013 Acquired absence of bilateral breasts and nipples: Secondary | ICD-10-CM

## 2022-04-06 DIAGNOSIS — M542 Cervicalgia: Secondary | ICD-10-CM
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
## 2022-04-08 NOTE — Progress Notes (Signed)
Patient Care Team: Saintclair Halsted, FNP as PCP - General (Family Medicine) Nicholas Lose, MD as Consulting Physician (Hematology and Oncology) Kyung Rudd, MD as Consulting Physician (Radiation Oncology) Rolm Bookbinder, MD as Consulting Physician (General Surgery)  DIAGNOSIS: No diagnosis found.  SUMMARY OF ONCOLOGIC HISTORY: Oncology History  Malignant neoplasm of upper-outer quadrant of left breast in female, estrogen receptor positive ([place])  04/14/2021 Initial Diagnosis   Palpable lump in the left breast, ultrasound and biopsy revealed grade 1-2 IDC with DCIS ER 80%, PR 80%, HER2 2+ by IHC FISH negative, Ki-67 15%   04/25/2021 Breast MRI   3.0 x 2.0 x 1.8 cm enhancing mass in the upper left breast consistent with the patient's biopsy-proven malignancy, and suspicious 1.1 cm mass in the far posterior lower outer quadrant of the right breast abutting the chest wall.     Genetic Testing   Negative genetic testing. No pathogenic variants identified on the [facility] CancerNext-Expanded+RNA panel. The report date is 05/13/2021.  The CancerNext-Expanded + RNAinsight gene panel offered by [employer] and includes sequencing and rearrangement analysis for the following 77 genes: IP, ALK, APC*, ATM*, AXIN2, BAP1, BARD1, BLM, BMPR1A, BRCA1*, BRCA2*, BRIP1*, CDC73, CDH1*,CDK4, CDKN1B, CDKN2A, CHEK2*, CTNNA1, DICER1, FANCC, FH, FLCN, GALNT12, KIF1B, LZTR1, MAX, MEN1, MET, MLH1*, MSH2*, MSH3, MSH6*, MUTYH*, NBN, NF1*, NF2, NTHL1, PALB2*, PHOX2B, PMS2*, POT1, PRKAR1A, PTCH1, PTEN*, RAD51C*, RAD51D*,RB1, RECQL, RET, SDHA, SDHAF2, SDHB, SDHC, SDHD, SMAD4, SMARCA4, SMARCB1, SMARCE1, STK11, SUFU, TMEM127, TP53*,TSC1, TSC2, VHL and XRCC2 (sequencing and deletion/duplication); EGFR, EGLN1, HOXB13, KIT, MITF, PDGFRA, POLD1 and POLE (sequencing only); EPCAM and GREM1 (deletion/duplication only).   06/07/2021 Cancer Staging   Staging form: Breast, AJCC 8th Edition - Pathologic stage from 06/07/2021: Stage IA  (pT1c, pN1a, cM0, G1, ER+, PR+, HER2-) - Signed by Gardenia Phlegm, NP on 06/09/2021 Stage prefix: Initial diagnosis Histologic grading system: 3 grade system   06/07/2021 Surgery   Bilateral mastectomies: Left mastectomy: Grade 1 IDC with DCIS, 1.5 cm, margins negative, 1/4 lymph node positive ER 90%, PR 80%, HER2 equivocal by IHC FISH negative ratio 1.26, Ki-67 5% Right mastectomy: Grade 1 IDC with DCIS 0.5 cm, 0/3 lymph nodes negative ER 100%, PR 60%, HER2 equivocal by IHC, FISH negative ratio 1.19, Ki-67 1%   07/18/2021 - 09/01/2021 Radiation Therapy   Site Technique Total Dose (Gy) Dose per Fx (Gy) Completed Fx Beam Energies  Chest Wall, Left: CW_L 3D 50.4/50.4 1.8 28/28 6X  Chest Wall, Left: CW_L_SCLV 3D 50.4/50.4 1.8 28/28 6X, 10X  Chest Wall, Left: CW_L_Bst Electron 10/10 2 5/5 6E     08/29/2021 -  Anti-estrogen oral therapy   letrozole 2.5 mg daily x 5-7 years     CHIEF COMPLIANT: Follow-up left breast cancer  INTERVAL HISTORY: Sierra Newman is a 51 y.o. with above-mentioned history of left breast cancer. She presents to the clinic today for follow-up.    ALLERGIES:  has No Known Allergies.  MEDICATIONS:  Current Outpatient Medications  Medication Sig Dispense Refill   acetaminophen (TYLENOL) 500 MG tablet Take 1,000 mg by mouth every 6 (six) hours as needed for mild pain.     ALPRAZolam (XANAX) 0.25 MG tablet Take 0.25 mg by mouth at bedtime as needed for anxiety.     fluticasone (FLONASE) 50 MCG/ACT nasal spray Place 1 spray into both nostrils daily.     gabapentin (NEURONTIN) 100 MG capsule Take 100 mg by mouth at bedtime.     Ibuprofen 200 MG CAPS 200 mg.  L-Lysine 1000 MG TABS Take by mouth.     lamoTRIgine (LAMICTAL) 150 MG tablet Take 150 mg by mouth daily.     letrozole (FEMARA) 2.5 MG tablet Take 1 tablet (2.5 mg total) by mouth daily. 90 tablet 3   losartan (COZAAR) 50 MG tablet Take 50 mg by mouth daily.     melatonin 5 MG TABS Take 5 mg by mouth at  bedtime.     prazosin (MINIPRESS) 1 MG capsule Take 1 mg by mouth at bedtime.     sertraline (ZOLOFT) 50 MG tablet Take 50 mg by mouth daily.     No current facility-administered medications for this visit.    PHYSICAL EXAMINATION: ECOG PERFORMANCE STATUS: {CHL ONC ECOG PS:[phone]}  There were no vitals filed for this visit. There were no vitals filed for this visit.  BREAST:*** No palpable masses or nodules in either right or left breasts. No palpable axillary supraclavicular or infraclavicular adenopathy no breast tenderness or nipple discharge. (exam performed in the presence of a chaperone)  LABORATORY DATA:  I have reviewed the data as listed     No data to display          No results found for: "WBC", "HGB", "HCT", "MCV", "PLT", "NEUTROABS"  ASSESSMENT & PLAN:  No problem-specific Assessment & Plan notes found for this encounter.    No orders of the defined types were placed in this encounter.  The patient has a good understanding of the overall plan. she agrees with it. she will call with any problems that may develop before the next visit here. Total time spent: 30 mins including face to face time and time spent for planning, charting and co-ordination of care   Suzzette Righter, [place] 04/08/22    I Gardiner Coins am scribing for Dr. Lindi Adie  ***

## 2022-04-11 ENCOUNTER — Inpatient Hospital Stay: Payer: Self-pay | Attending: Genetic Counselor | Admitting: Hematology and Oncology

## 2022-04-11 ENCOUNTER — Ambulatory Visit
Payer: No Typology Code available for payment source | Attending: Plastic Surgery | Admitting: Rehabilitative and Restorative Service Providers"

## 2022-04-11 ENCOUNTER — Encounter: Payer: Self-pay | Admitting: Rehabilitative and Restorative Service Providers"

## 2022-04-11 VITALS — BP 147/86 | HR 92 | Temp 97.5°F | Resp 18 | Ht 64.0 in | Wt 141.4 lb

## 2022-04-11 DIAGNOSIS — R293 Abnormal posture: Secondary | ICD-10-CM | POA: Insufficient documentation

## 2022-04-11 DIAGNOSIS — N6313 Unspecified lump in the right breast, lower outer quadrant: Secondary | ICD-10-CM | POA: Insufficient documentation

## 2022-04-11 DIAGNOSIS — Z9013 Acquired absence of bilateral breasts and nipples: Secondary | ICD-10-CM | POA: Insufficient documentation

## 2022-04-11 DIAGNOSIS — Z170 Estrogen receptor positive status [ER+]: Secondary | ICD-10-CM | POA: Insufficient documentation

## 2022-04-11 DIAGNOSIS — Z79899 Other long term (current) drug therapy: Secondary | ICD-10-CM | POA: Insufficient documentation

## 2022-04-11 DIAGNOSIS — C50412 Malignant neoplasm of upper-outer quadrant of left female breast: Secondary | ICD-10-CM | POA: Insufficient documentation

## 2022-04-11 DIAGNOSIS — M25611 Stiffness of right shoulder, not elsewhere classified: Secondary | ICD-10-CM | POA: Insufficient documentation

## 2022-04-11 DIAGNOSIS — M898X9 Other specified disorders of bone, unspecified site: Secondary | ICD-10-CM | POA: Insufficient documentation

## 2022-04-11 DIAGNOSIS — M542 Cervicalgia: Secondary | ICD-10-CM | POA: Insufficient documentation

## 2022-04-11 DIAGNOSIS — Z79811 Long term (current) use of aromatase inhibitors: Secondary | ICD-10-CM | POA: Insufficient documentation

## 2022-04-11 DIAGNOSIS — M25612 Stiffness of left shoulder, not elsewhere classified: Secondary | ICD-10-CM | POA: Insufficient documentation
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
## 2022-04-11 NOTE — Assessment & Plan Note (Signed)
06/07/2021:Bilateral mastectomies: Left mastectomy: Grade 1 IDC with DCIS, 1.5 cm, margins negative, 1/4 lymph node positive ER 90%, PR 80%, HER2 equivocal by IHC FISH negative ratio 1.26, Ki-67 5% Right mastectomy: Grade 1 IDC with DCIS 0.5 cm, 0/3 lymph nodes negative ER 100%, PR 60%, HER2 equivocal by IHC, FISH negative ratio 1.19, Ki-67 1% MammaPrint: Low risk     Treatment plan: 1. Adjuvant radiation therapy completing 09/01/2021 2. Adjuvant antiestrogen therapy:  ([place] 118, estradiol less than 2.5: Patient is menopausal) therefore we will start her on letrozole 2.5 mg daily x5-7 years started 08/29/2021  Letrozole toxicities:  Breast cancer surveillance: Breast exam 04/11/2022: Benign No role of imaging since she had bilateral mastectomies.   We are doing [employer] circulating tumor DNA testing Bone density at [facility] 09/16/2021: T score -0.5: Normal   Return to clinic  in 1 year for follow-up

## 2022-04-13 ENCOUNTER — Ambulatory Visit: Payer: No Typology Code available for payment source

## 2022-04-13 DIAGNOSIS — Z9013 Acquired absence of bilateral breasts and nipples: Secondary | ICD-10-CM

## 2022-04-13 DIAGNOSIS — M542 Cervicalgia: Secondary | ICD-10-CM

## 2022-04-13 DIAGNOSIS — R293 Abnormal posture: Secondary | ICD-10-CM
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
## 2022-04-26 ENCOUNTER — Ambulatory Visit: Payer: No Typology Code available for payment source

## 2022-04-26 DIAGNOSIS — R293 Abnormal posture: Secondary | ICD-10-CM

## 2022-04-26 DIAGNOSIS — M542 Cervicalgia: Secondary | ICD-10-CM

## 2022-04-26 DIAGNOSIS — M25611 Stiffness of right shoulder, not elsewhere classified: Secondary | ICD-10-CM

## 2022-04-26 DIAGNOSIS — M25612 Stiffness of left shoulder, not elsewhere classified: Secondary | ICD-10-CM
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
## 2022-04-27 ENCOUNTER — Ambulatory Visit: Payer: No Typology Code available for payment source

## 2022-04-27 DIAGNOSIS — M25612 Stiffness of left shoulder, not elsewhere classified: Secondary | ICD-10-CM

## 2022-04-27 DIAGNOSIS — R293 Abnormal posture: Secondary | ICD-10-CM

## 2022-04-27 DIAGNOSIS — M542 Cervicalgia: Secondary | ICD-10-CM

## 2022-04-27 DIAGNOSIS — M25611 Stiffness of right shoulder, not elsewhere classified: Secondary | ICD-10-CM
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
## 2022-05-03 ENCOUNTER — Ambulatory Visit: Payer: Self-pay | Admitting: Rehabilitative and Restorative Service Providers"

## 2022-05-03 ENCOUNTER — Encounter: Payer: Self-pay | Admitting: Rehabilitative and Restorative Service Providers"

## 2022-05-03 DIAGNOSIS — M542 Cervicalgia: Secondary | ICD-10-CM

## 2022-05-03 DIAGNOSIS — R293 Abnormal posture: Secondary | ICD-10-CM

## 2022-05-03 DIAGNOSIS — Z9013 Acquired absence of bilateral breasts and nipples: Secondary | ICD-10-CM
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
## 2022-05-04 ENCOUNTER — Ambulatory Visit: Payer: No Typology Code available for payment source | Admitting: Hematology and Oncology

## 2022-05-04 ENCOUNTER — Encounter: Payer: Self-pay | Admitting: Physical Therapy

## 2022-05-04 ENCOUNTER — Ambulatory Visit: Payer: Self-pay | Admitting: Physical Therapy

## 2022-05-04 DIAGNOSIS — R293 Abnormal posture: Secondary | ICD-10-CM

## 2022-05-04 DIAGNOSIS — Z9013 Acquired absence of bilateral breasts and nipples: Secondary | ICD-10-CM

## 2022-05-04 DIAGNOSIS — M542 Cervicalgia: Secondary | ICD-10-CM
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
## 2022-05-10 ENCOUNTER — Ambulatory Visit: Payer: Self-pay | Attending: Plastic Surgery

## 2022-05-10 DIAGNOSIS — M25612 Stiffness of left shoulder, not elsewhere classified: Secondary | ICD-10-CM | POA: Insufficient documentation

## 2022-05-10 DIAGNOSIS — R293 Abnormal posture: Secondary | ICD-10-CM | POA: Insufficient documentation

## 2022-05-10 DIAGNOSIS — M25611 Stiffness of right shoulder, not elsewhere classified: Secondary | ICD-10-CM | POA: Insufficient documentation

## 2022-05-10 DIAGNOSIS — M542 Cervicalgia: Secondary | ICD-10-CM | POA: Insufficient documentation
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
## 2022-05-11 ENCOUNTER — Ambulatory Visit: Payer: Self-pay

## 2022-06-06 ENCOUNTER — Ambulatory Visit (HOSPITAL_COMMUNITY)
Admission: RE | Admit: 2022-06-06 | Discharge: 2022-06-06 | Disposition: A | Discharge status: Home / Self Care | Payer: No Typology Code available for payment source | Source: Ambulatory Visit | Attending: Hematology and Oncology | Admitting: Hematology and Oncology

## 2022-06-06 DIAGNOSIS — C50412 Malignant neoplasm of upper-outer quadrant of left female breast: Secondary | ICD-10-CM | POA: Insufficient documentation

## 2022-06-06 DIAGNOSIS — Z170 Estrogen receptor positive status [ER+]: Secondary | ICD-10-CM | POA: Insufficient documentation

## 2022-06-06 MED ORDER — IOHEXOL 300 MG/ML  SOLN
75.0000 mL | Freq: Once | INTRAMUSCULAR | Status: AC | PRN
  Administered 2022-06-06: 75 mL via INTRAVENOUS

## 2022-06-06 MED ORDER — SODIUM CHLORIDE (PF) 0.9 % IJ SOLN
INTRAMUSCULAR | Status: AC
  Filled 2022-06-06: qty 50

## 2022-06-08 ENCOUNTER — Inpatient Hospital Stay: Payer: Self-pay | Attending: Genetic Counselor | Admitting: Hematology and Oncology

## 2022-06-08 ENCOUNTER — Telehealth: Payer: Self-pay | Admitting: *Deleted

## 2022-06-08 ENCOUNTER — Other Ambulatory Visit: Payer: Self-pay | Admitting: *Deleted

## 2022-06-08 VITALS — BP 155/89 | HR 92 | Temp 98.0°F | Resp 16 | Wt 136.3 lb

## 2022-06-08 DIAGNOSIS — Z9013 Acquired absence of bilateral breasts and nipples: Secondary | ICD-10-CM | POA: Insufficient documentation

## 2022-06-08 DIAGNOSIS — Z79899 Other long term (current) drug therapy: Secondary | ICD-10-CM | POA: Insufficient documentation

## 2022-06-08 DIAGNOSIS — Z170 Estrogen receptor positive status [ER+]: Secondary | ICD-10-CM | POA: Insufficient documentation

## 2022-06-08 DIAGNOSIS — C50412 Malignant neoplasm of upper-outer quadrant of left female breast: Secondary | ICD-10-CM | POA: Insufficient documentation

## 2022-06-08 DIAGNOSIS — Z79811 Long term (current) use of aromatase inhibitors: Secondary | ICD-10-CM | POA: Insufficient documentation

## 2022-06-08 DIAGNOSIS — N6313 Unspecified lump in the right breast, lower outer quadrant: Secondary | ICD-10-CM | POA: Insufficient documentation

## 2022-06-08 DIAGNOSIS — R911 Solitary pulmonary nodule: Secondary | ICD-10-CM | POA: Insufficient documentation

## 2022-06-08 NOTE — Progress Notes (Signed)
Patient Care Team: Saintclair Halsted, FNP as PCP - General (Family Medicine) Nicholas Lose, MD as Consulting Physician (Hematology and Oncology) Kyung Rudd, MD as Consulting Physician (Radiation Oncology) Rolm Bookbinder, MD as Consulting Physician (General Surgery)  DIAGNOSIS:  Encounter Diagnosis  Name Primary?   Malignant neoplasm of upper-outer quadrant of left breast in female, estrogen receptor positive ([place]) Yes    SUMMARY OF ONCOLOGIC HISTORY: Oncology History  Malignant neoplasm of upper-outer quadrant of left breast in female, estrogen receptor positive ([place])  04/14/2021 Initial Diagnosis   Palpable lump in the left breast, ultrasound and biopsy revealed grade 1-2 IDC with DCIS ER 80%, PR 80%, HER2 2+ by IHC FISH negative, Ki-67 15%   04/25/2021 Breast MRI   3.0 x 2.0 x 1.8 cm enhancing mass in the upper left breast consistent with the patient's biopsy-proven malignancy, and suspicious 1.1 cm mass in the far posterior lower outer quadrant of the right breast abutting the chest wall.     Genetic Testing   Negative genetic testing. No pathogenic variants identified on the [facility] CancerNext-Expanded+RNA panel. The report date is 05/13/2021.  The CancerNext-Expanded + RNAinsight gene panel offered by [employer] and includes sequencing and rearrangement analysis for the following 77 genes: IP, ALK, APC*, ATM*, AXIN2, BAP1, BARD1, BLM, BMPR1A, BRCA1*, BRCA2*, BRIP1*, CDC73, CDH1*,CDK4, CDKN1B, CDKN2A, CHEK2*, CTNNA1, DICER1, FANCC, FH, FLCN, GALNT12, KIF1B, LZTR1, MAX, MEN1, MET, MLH1*, MSH2*, MSH3, MSH6*, MUTYH*, NBN, NF1*, NF2, NTHL1, PALB2*, PHOX2B, PMS2*, POT1, PRKAR1A, PTCH1, PTEN*, RAD51C*, RAD51D*,RB1, RECQL, RET, SDHA, SDHAF2, SDHB, SDHC, SDHD, SMAD4, SMARCA4, SMARCB1, SMARCE1, STK11, SUFU, TMEM127, TP53*,TSC1, TSC2, VHL and XRCC2 (sequencing and deletion/duplication); EGFR, EGLN1, HOXB13, KIT, MITF, PDGFRA, POLD1 and POLE (sequencing only); EPCAM and GREM1  (deletion/duplication only).   06/07/2021 Cancer Staging   Staging form: Breast, AJCC 8th Edition - Pathologic stage from 06/07/2021: Stage IA (pT1c, pN1a, cM0, G1, ER+, PR+, HER2-) - Signed by Gardenia Phlegm, NP on 06/09/2021 Stage prefix: Initial diagnosis Histologic grading system: 3 grade system   06/07/2021 Surgery   Bilateral mastectomies: Left mastectomy: Grade 1 IDC with DCIS, 1.5 cm, margins negative, 1/4 lymph node positive ER 90%, PR 80%, HER2 equivocal by IHC FISH negative ratio 1.26, Ki-67 5% Right mastectomy: Grade 1 IDC with DCIS 0.5 cm, 0/3 lymph nodes negative ER 100%, PR 60%, HER2 equivocal by IHC, FISH negative ratio 1.19, Ki-67 1%   07/18/2021 - 09/01/2021 Radiation Therapy   Site Technique Total Dose (Gy) Dose per Fx (Gy) Completed Fx Beam Energies  Chest Wall, Left: CW_L 3D 50.4/50.4 1.8 28/28 6X  Chest Wall, Left: CW_L_SCLV 3D 50.4/50.4 1.8 28/28 6X, 10X  Chest Wall, Left: CW_L_Bst Electron 10/10 2 5/5 6E     08/29/2021 -  Anti-estrogen oral therapy   letrozole 2.5 mg daily x 5-7 years     CHIEF COMPLIANT:  Follow-up left breast cancer letrozole    INTERVAL HISTORY: Sierra Newman is a 52 y.o. with above-mentioned history of left breast cancer. Currently on letrozole. She presents to the clinic today for follow-up. She denies any symptoms in her belly. Denies any stomach bug, but says she had a lot of anxiety.   ALLERGIES:  has No Known Allergies.  MEDICATIONS:  Current Outpatient Medications  Medication Sig Dispense Refill   acetaminophen (TYLENOL) 500 MG tablet Take 1,000 mg by mouth every 6 (six) hours as needed for mild pain.     ALPRAZolam (XANAX) 0.25 MG tablet Take 0.25 mg by mouth at bedtime as needed for  anxiety.     cyclobenzaprine (FLEXERIL) 10 MG tablet Take by mouth.     fluticasone (FLONASE) 50 MCG/ACT nasal spray Place 1 spray into both nostrils daily.     gabapentin (NEURONTIN) 100 MG capsule Take 100 mg by mouth at bedtime.      Ibuprofen 200 MG CAPS 200 mg.     L-Lysine 1000 MG TABS Take by mouth.     lamoTRIgine (LAMICTAL) 150 MG tablet Take 150 mg by mouth daily.     letrozole (FEMARA) 2.5 MG tablet Take 1 tablet (2.5 mg total) by mouth daily. 90 tablet 3   losartan (COZAAR) 50 MG tablet Take 50 mg by mouth daily.     melatonin 5 MG TABS Take 5 mg by mouth at bedtime.     methocarbamol (ROBAXIN) 500 MG tablet Take by mouth.     prazosin (MINIPRESS) 1 MG capsule Take 1 mg by mouth at bedtime.     Red Yeast Rice 600 MG CAPS      sertraline (ZOLOFT) 50 MG tablet Take 50 mg by mouth daily.     simvastatin (ZOCOR) 20 MG tablet      simvastatin (ZOCOR) 5 MG tablet Take by mouth.     Turmeric Curcumin 500 MG CAPS      No current facility-administered medications for this visit.    PHYSICAL EXAMINATION: ECOG PERFORMANCE STATUS: 1 - Symptomatic but completely ambulatory  Vitals:   06/08/22 1516  BP: (!) 155/89  Pulse: 92  Resp: 16  Temp: 98 F (36.7 C)  SpO2: 98%   Filed Weights   06/08/22 1516  Weight: 136 lb 5 oz (61.8 kg)     LABORATORY DATA:  I have reviewed the data as listed     No data to display          No results found for: "WBC", "HGB", "HCT", "MCV", "PLT", "NEUTROABS"  ASSESSMENT & PLAN:  Malignant neoplasm of upper-outer quadrant of left breast in female, estrogen receptor positive ([place]) 06/07/2021:Bilateral mastectomies: Left mastectomy: Grade 1 IDC with DCIS, 1.5 cm, margins negative, 1/4 lymph node positive ER 90%, PR 80%, HER2 equivocal by IHC FISH negative ratio 1.26, Ki-67 5% Right mastectomy: Grade 1 IDC with DCIS 0.5 cm, 0/3 lymph nodes negative ER 100%, PR 60%, HER2 equivocal by IHC, FISH negative ratio 1.19, Ki-67 1% MammaPrint: Low risk  Treatment plan: 1. Adjuvant radiation therapy completing 09/01/2021 2. Adjuvant antiestrogen therapy:  ([place] 118, estradiol less than 2.5: Patient is menopausal) therefore we will start her on letrozole 2.5 mg daily x5-7 years started  08/29/2021 ----------------------------------------------------------------------- CT chest follow-up of lung nodule: Lung nodules are stable, left upper lobe opacity 2.1 x 0.8 cm, soft tissue upper retroperitoneum left para-aortic region of unclear etiology  Treatment plan: PET CT scan on Monday and follow-up on Tuesday to discuss results.    Orders Placed This Encounter  Procedures   NM PET Image Initial (PI) Skull Base To Thigh    Standing Status:   Future    Standing Expiration Date:   06/08/2023    Order Specific Question:   If indicated for the ordered procedure, I authorize the administration of a radiopharmaceutical per Radiology protocol    Answer:   Yes    Order Specific Question:   Is the patient pregnant?    Answer:   No    Order Specific Question:   Preferred imaging location?    Answer:   [facility]   The patient has a good  understanding of the overall plan. she agrees with it. she will call with any problems that may develop before the next visit here. Total time spent: 30 mins including face to face time and time spent for planning, charting and co-ordination of care   Harriette Ohara, MD 06/08/22    I Gardiner Coins am acting as a Education administrator for [employer]  I have reviewed the above documentation for accuracy and completeness, and I agree with the above.

## 2022-06-08 NOTE — Telephone Encounter (Signed)
Received a message from patient that she would like to come in today to discuss recent scan instead of tomorrow.  Informed her Dr.Gudena did have a 330 open.  Patient's appt r/s to today 2/1 at 330.

## 2022-06-08 NOTE — Assessment & Plan Note (Signed)
06/07/2021:Bilateral mastectomies: Left mastectomy: Grade 1 IDC with DCIS, 1.5 cm, margins negative, 1/4 lymph node positive ER 90%, PR 80%, HER2 equivocal by IHC FISH negative ratio 1.26, Ki-67 5% Right mastectomy: Grade 1 IDC with DCIS 0.5 cm, 0/3 lymph nodes negative ER 100%, PR 60%, HER2 equivocal by IHC, FISH negative ratio 1.19, Ki-67 1% MammaPrint: Low risk  Treatment plan: 1. Adjuvant radiation therapy completing 09/01/2021 2. Adjuvant antiestrogen therapy:  ([place] 118, estradiol less than 2.5: Patient is menopausal) therefore we will start her on letrozole 2.5 mg daily x5-7 years started 08/29/2021 ----------------------------------------------------------------------- CT chest follow-up of lung nodule: Lung nodules are stable, left upper lobe opacity 2.1 x 0.8 cm, soft tissue upper retroperitoneum left para-aortic region of unclear etiology  Treatment plan: PET CT scan on Monday and follow-up on Tuesday to discuss results.

## 2022-06-08 NOTE — Progress Notes (Signed)
Per MD request orders placed and faxed for [employer] testing.

## 2022-06-09 ENCOUNTER — Inpatient Hospital Stay: Payer: No Typology Code available for payment source | Admitting: Hematology and Oncology

## 2022-06-12 ENCOUNTER — Ambulatory Visit (HOSPITAL_COMMUNITY)
Admission: RE | Admit: 2022-06-12 | Discharge: 2022-06-12 | Disposition: A | Discharge status: Home / Self Care | Payer: Self-pay | Source: Ambulatory Visit | Attending: Hematology and Oncology | Admitting: Hematology and Oncology

## 2022-06-12 DIAGNOSIS — C50412 Malignant neoplasm of upper-outer quadrant of left female breast: Secondary | ICD-10-CM | POA: Insufficient documentation

## 2022-06-12 DIAGNOSIS — Z170 Estrogen receptor positive status [ER+]: Secondary | ICD-10-CM | POA: Insufficient documentation

## 2022-06-12 LAB — GLUCOSE, CAPILLARY: Glucose-Capillary: 95 mg/dL (ref 70–99)

## 2022-06-12 MED ORDER — FLUDEOXYGLUCOSE F - 18 (FDG) INJECTION
6.7000 | Freq: Once | INTRAVENOUS | Status: AC
  Administered 2022-06-12: 6.7 via INTRAVENOUS

## 2022-06-13 ENCOUNTER — Inpatient Hospital Stay (HOSPITAL_BASED_OUTPATIENT_CLINIC_OR_DEPARTMENT_OTHER): Payer: Self-pay | Admitting: Hematology and Oncology

## 2022-06-13 VITALS — BP 146/90 | HR 109 | Temp 97.7°F | Resp 18 | Wt 134.2 lb

## 2022-06-13 DIAGNOSIS — Z170 Estrogen receptor positive status [ER+]: Secondary | ICD-10-CM

## 2022-06-13 DIAGNOSIS — C50412 Malignant neoplasm of upper-outer quadrant of left female breast: Secondary | ICD-10-CM

## 2022-06-13 NOTE — Assessment & Plan Note (Signed)
06/07/2021:Bilateral mastectomies: Left mastectomy: Grade 1 IDC with DCIS, 1.5 cm, margins negative, 1/4 lymph node positive ER 90%, PR 80%, HER2 equivocal by IHC FISH negative ratio 1.26, Ki-67 5% Right mastectomy: Grade 1 IDC with DCIS 0.5 cm, 0/3 lymph nodes negative ER 100%, PR 60%, HER2 equivocal by IHC, FISH negative ratio 1.19, Ki-67 1% MammaPrint: Low risk   Treatment plan: 1. Adjuvant radiation therapy completing 09/01/2021 2. Adjuvant antiestrogen therapy:  ([place] 118, estradiol less than 2.5: Patient is menopausal) therefore we will start her on letrozole 2.5 mg daily x5-7 years started 08/29/2021 ----------------------------------------------------------------------- CT chest follow-up of lung nodule: Lung nodules are stable, left upper lobe opacity 2.1 x 0.8 cm, soft tissue upper retroperitoneum left para-aortic region of unclear etiology  PET/CT 12/13/4164: No hypermetabolic lymph nodes.  No suspicious lung nodules.  The left lateral chest wall abnormality is felt to be postradiation changes without any hypermetabolic activity.  Left lower lobe pulmonary nodule: Stable not associated with activity.  Abdomen: Benign  Radiology review: I discussed the radiology findings and did not recommend any further interventions.  Return to clinic in 1 year for follow-up

## 2022-06-13 NOTE — Progress Notes (Signed)
Patient Care Team: Saintclair Halsted, FNP as PCP - General (Family Medicine) Nicholas Lose, MD as Consulting Physician (Hematology and Oncology) Kyung Rudd, MD as Consulting Physician (Radiation Oncology) Rolm Bookbinder, MD as Consulting Physician (General Surgery)  DIAGNOSIS:  Encounter Diagnosis  Name Primary?   Malignant neoplasm of upper-outer quadrant of left breast in female, estrogen receptor positive ([place]) Yes    SUMMARY OF ONCOLOGIC HISTORY: Oncology History  Malignant neoplasm of upper-outer quadrant of left breast in female, estrogen receptor positive ([place])  04/14/2021 Initial Diagnosis   Palpable lump in the left breast, ultrasound and biopsy revealed grade 1-2 IDC with DCIS ER 80%, PR 80%, HER2 2+ by IHC FISH negative, Ki-67 15%   04/25/2021 Breast MRI   3.0 x 2.0 x 1.8 cm enhancing mass in the upper left breast consistent with the patient's biopsy-proven malignancy, and suspicious 1.1 cm mass in the far posterior lower outer quadrant of the right breast abutting the chest wall.     Genetic Testing   Negative genetic testing. No pathogenic variants identified on the [facility] CancerNext-Expanded+RNA panel. The report date is 05/13/2021.  The CancerNext-Expanded + RNAinsight gene panel offered by [employer] and includes sequencing and rearrangement analysis for the following 77 genes: IP, ALK, APC*, ATM*, AXIN2, BAP1, BARD1, BLM, BMPR1A, BRCA1*, BRCA2*, BRIP1*, CDC73, CDH1*,CDK4, CDKN1B, CDKN2A, CHEK2*, CTNNA1, DICER1, FANCC, FH, FLCN, GALNT12, KIF1B, LZTR1, MAX, MEN1, MET, MLH1*, MSH2*, MSH3, MSH6*, MUTYH*, NBN, NF1*, NF2, NTHL1, PALB2*, PHOX2B, PMS2*, POT1, PRKAR1A, PTCH1, PTEN*, RAD51C*, RAD51D*,RB1, RECQL, RET, SDHA, SDHAF2, SDHB, SDHC, SDHD, SMAD4, SMARCA4, SMARCB1, SMARCE1, STK11, SUFU, TMEM127, TP53*,TSC1, TSC2, VHL and XRCC2 (sequencing and deletion/duplication); EGFR, EGLN1, HOXB13, KIT, MITF, PDGFRA, POLD1 and POLE (sequencing only); EPCAM and GREM1  (deletion/duplication only).   06/07/2021 Cancer Staging   Staging form: Breast, AJCC 8th Edition - Pathologic stage from 06/07/2021: Stage IA (pT1c, pN1a, cM0, G1, ER+, PR+, HER2-) - Signed by Gardenia Phlegm, NP on 06/09/2021 Stage prefix: Initial diagnosis Histologic grading system: 3 grade system   06/07/2021 Surgery   Bilateral mastectomies: Left mastectomy: Grade 1 IDC with DCIS, 1.5 cm, margins negative, 1/4 lymph node positive ER 90%, PR 80%, HER2 equivocal by IHC FISH negative ratio 1.26, Ki-67 5% Right mastectomy: Grade 1 IDC with DCIS 0.5 cm, 0/3 lymph nodes negative ER 100%, PR 60%, HER2 equivocal by IHC, FISH negative ratio 1.19, Ki-67 1%   07/18/2021 - 09/01/2021 Radiation Therapy   Site Technique Total Dose (Gy) Dose per Fx (Gy) Completed Fx Beam Energies  Chest Wall, Left: CW_L 3D 50.4/50.4 1.8 28/28 6X  Chest Wall, Left: CW_L_SCLV 3D 50.4/50.4 1.8 28/28 6X, 10X  Chest Wall, Left: CW_L_Bst Electron 10/10 2 5/5 6E     08/29/2021 -  Anti-estrogen oral therapy   letrozole 2.5 mg daily x 5-7 years     CHIEF COMPLIANT: Review Pet/ Left breast cancer  INTERVAL HISTORY: Sierra Newman is a 52 y.o. with above-mentioned history of left breast cancer. Currently on letrozole. She presents to the clinic today for follow-up. She reports no new concerns to the clinic today. Denies any abdominal pain. She is tolerating the letrozole extremely well.   ALLERGIES:  has No Known Allergies.  MEDICATIONS:  Current Outpatient Medications  Medication Sig Dispense Refill   acetaminophen (TYLENOL) 500 MG tablet Take 1,000 mg by mouth every 6 (six) hours as needed for mild pain.     ALPRAZolam (XANAX) 0.25 MG tablet Take 0.25 mg by mouth at bedtime as needed for anxiety.  cyclobenzaprine (FLEXERIL) 10 MG tablet Take by mouth.     fluticasone (FLONASE) 50 MCG/ACT nasal spray Place 1 spray into both nostrils daily.     gabapentin (NEURONTIN) 100 MG capsule Take 100 mg by mouth at  bedtime.     Ibuprofen 200 MG CAPS 200 mg.     L-Lysine 1000 MG TABS Take by mouth.     lamoTRIgine (LAMICTAL) 150 MG tablet Take 150 mg by mouth daily.     letrozole (FEMARA) 2.5 MG tablet Take 1 tablet (2.5 mg total) by mouth daily. 90 tablet 3   losartan (COZAAR) 50 MG tablet Take 50 mg by mouth daily.     melatonin 5 MG TABS Take 5 mg by mouth at bedtime.     methocarbamol (ROBAXIN) 500 MG tablet Take by mouth.     prazosin (MINIPRESS) 1 MG capsule Take 1 mg by mouth at bedtime.     Red Yeast Rice 600 MG CAPS      sertraline (ZOLOFT) 50 MG tablet Take 50 mg by mouth daily.     simvastatin (ZOCOR) 20 MG tablet      simvastatin (ZOCOR) 5 MG tablet Take by mouth.     Turmeric Curcumin 500 MG CAPS      No current facility-administered medications for this visit.    PHYSICAL EXAMINATION: ECOG PERFORMANCE STATUS: 1 - Symptomatic but completely ambulatory  Vitals:   06/13/22 1201  BP: (!) 146/90  Pulse: (!) 109  Resp: 18  Temp: 97.7 F (36.5 C)  SpO2: 99%   Filed Weights   06/13/22 1201  Weight: 134 lb 4 oz (60.9 kg)      LABORATORY DATA:  I have reviewed the data as listed     No data to display          No results found for: "WBC", "HGB", "HCT", "MCV", "PLT", "NEUTROABS"  ASSESSMENT & PLAN:  Malignant neoplasm of upper-outer quadrant of left breast in female, estrogen receptor positive ([place]) 06/07/2021:Bilateral mastectomies: Left mastectomy: Grade 1 IDC with DCIS, 1.5 cm, margins negative, 1/4 lymph node positive ER 90%, PR 80%, HER2 equivocal by IHC FISH negative ratio 1.26, Ki-67 5% Right mastectomy: Grade 1 IDC with DCIS 0.5 cm, 0/3 lymph nodes negative ER 100%, PR 60%, HER2 equivocal by IHC, FISH negative ratio 1.19, Ki-67 1% MammaPrint: Low risk   Treatment plan: 1. Adjuvant radiation therapy completing 09/01/2021 2. Adjuvant antiestrogen therapy:  ([place] 118, estradiol less than 2.5: Patient is menopausal) therefore we will start her on letrozole 2.5 mg daily  x5-7 years started 08/29/2021 ----------------------------------------------------------------------- CT chest follow-up of lung nodule: Lung nodules are stable, left upper lobe opacity 2.1 x 0.8 cm, soft tissue upper retroperitoneum left para-aortic region of unclear etiology  PET/CT 01/11/2840: No hypermetabolic lymph nodes.  No suspicious lung nodules.  The left lateral chest wall abnormality is felt to be postradiation changes without any hypermetabolic activity.  Left lower lobe pulmonary nodule: Stable not associated with activity.  Abdomen: Benign  Radiology review: I discussed the radiology findings and did not recommend any further interventions.  Return to clinic in 1 year for follow-up.  We will obtain a CT chest annually for follow-up of lung nodules.    Orders Placed This Encounter  Procedures   CT Chest W Contrast    Standing Status:   Future    Standing Expiration Date:   06/14/2023    Order Specific Question:   If indicated for the ordered procedure, I authorize the administration  of contrast media per Radiology protocol    Answer:   Yes    Order Specific Question:   Does the patient have a contrast media/X-ray dye allergy?    Answer:   No    Order Specific Question:   Is patient pregnant?    Answer:   No    Order Specific Question:   Preferred imaging location?    Answer:   [facility]   The patient has a good understanding of the overall plan. she agrees with it. she will call with any problems that may develop before the next visit here. Total time spent: 30 mins including face to face time and time spent for planning, charting and co-ordination of care   Harriette Ohara, MD 06/13/22    I Gardiner Coins am acting as a Education administrator for [employer]  I have reviewed the above documentation for accuracy and completeness, and I agree with the above.

## 2022-06-14 ENCOUNTER — Telehealth: Payer: Self-pay | Admitting: Hematology and Oncology

## 2022-06-14 NOTE — Telephone Encounter (Signed)
Scheduled appointment per 2/6 los. Left voicemail.

## 2022-06-21 ENCOUNTER — Encounter: Payer: Self-pay | Admitting: *Deleted

## 2022-06-21 NOTE — Progress Notes (Signed)
Receive a message from [facility] representative stating patient did not respond to their call to schedule mobile phlebotomy draw.  Representative states patient will need to reach out to [facility] directly at [phone] option #1 [phone] to schedule if patient wishes to proceed.

## 2022-08-10 ENCOUNTER — Encounter: Payer: Self-pay | Admitting: Hematology and Oncology

## 2022-08-17 ENCOUNTER — Encounter: Payer: Self-pay | Admitting: Internal Medicine

## 2022-08-23 ENCOUNTER — Ambulatory Visit (AMBULATORY_SURGERY_CENTER): Payer: Self-pay

## 2022-08-23 VITALS — Ht 64.0 in | Wt 135.0 lb

## 2022-08-23 DIAGNOSIS — Z1211 Encounter for screening for malignant neoplasm of colon: Secondary | ICD-10-CM

## 2022-08-23 MED ORDER — NA SULFATE-K SULFATE-MG SULF 17.5-3.13-1.6 GM/177ML PO SOLN: 1.0000 | Freq: Once | ORAL | 0 refills | Status: AC

## 2022-08-23 NOTE — Progress Notes (Signed)
Pre visit completed via phone call; Patient verified name, DOB, and address;  No egg or soy allergy known to patient  No issues known to pt with past sedation with any surgeries or procedures Patient denies ever being told they had issues or difficulty with intubation  No FH of Malignant Hyperthermia Pt is not on diet pills Pt is not on home 02  Pt is not on blood thinners  Pt denies issues with constipation  No A fib or A flutter Have any cardiac testing pending--NO Pt instructed to use [email or web address] or GoodRx for a price reduction on prep   Insurance verified during PV appt=self pay  PreVisit completed and red dot placed by patient's name on their procedure day (on provider's schedule).    GoodRx coupon codes sent in the RX for the patient;

## 2022-09-06 HISTORY — PX: POLYPECTOMY: SHX149

## 2022-09-06 HISTORY — PX: COLONOSCOPY: SHX174

## 2022-09-11 ENCOUNTER — Encounter: Payer: Self-pay | Admitting: Internal Medicine

## 2022-09-11 ENCOUNTER — Ambulatory Visit (AMBULATORY_SURGERY_CENTER): Payer: Self-pay | Admitting: Internal Medicine

## 2022-09-11 VITALS — BP 143/69 | HR 65 | Temp 97.3°F | Resp 14 | Ht 64.0 in | Wt 135.0 lb

## 2022-09-11 DIAGNOSIS — D120 Benign neoplasm of cecum: Secondary | ICD-10-CM

## 2022-09-11 DIAGNOSIS — Z1211 Encounter for screening for malignant neoplasm of colon: Secondary | ICD-10-CM

## 2022-09-11 DIAGNOSIS — D128 Benign neoplasm of rectum: Secondary | ICD-10-CM

## 2022-09-11 DIAGNOSIS — Z860101 Personal history of adenomatous and serrated colon polyps: Secondary | ICD-10-CM

## 2022-09-11 DIAGNOSIS — D122 Benign neoplasm of ascending colon: Secondary | ICD-10-CM

## 2022-09-11 DIAGNOSIS — D124 Benign neoplasm of descending colon: Secondary | ICD-10-CM

## 2022-09-11 DIAGNOSIS — Z86010 Personal history of colonic polyps: Secondary | ICD-10-CM | POA: Insufficient documentation

## 2022-09-11 HISTORY — DX: Personal history of adenomatous and serrated colon polyps: Z86.0101

## 2022-09-11 MED ORDER — SODIUM CHLORIDE 0.9 % IV SOLN: 500.0000 mL | INTRAVENOUS | Status: DC
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
## 2022-09-11 NOTE — Progress Notes (Signed)
Vss nad trans to pacu 

## 2022-09-11 NOTE — Progress Notes (Signed)
Called to room to assist during endoscopic procedure.  Patient ID and intended procedure confirmed with present staff. Received instructions for my participation in the procedure from the performing physician.  

## 2022-09-11 NOTE — Op Note (Signed)
[facility] Patient Name: Sierra Newman Procedure Date: 09/11/2022 7:12 AM MRN: [id number] Endoscopist: Iva Boop , MD, [id number] Age: 52 Referring MD:  Date of Birth: 1971/01/17 Gender: Female Account #: [id number] Procedure:                Colonoscopy Indications:              Screening for colorectal malignant neoplasm, This                            is the patient's first colonoscopy Medicines:                Monitored Anesthesia Care Procedure:                Pre-Anesthesia Assessment:                           - Prior to the procedure, a History and Physical                            was performed, and patient medications and                            allergies were reviewed. The patient's tolerance of                            previous anesthesia was also reviewed. The risks                            and benefits of the procedure and the sedation                            options and risks were discussed with the patient.                            All questions were answered, and informed consent                            was obtained. Prior Anticoagulants: The patient has                            taken no anticoagulant or antiplatelet agents. ASA                            Grade Assessment: II - A patient with mild systemic                            disease. After reviewing the risks and benefits,                            the patient was deemed in satisfactory condition to                            undergo the procedure.  After obtaining informed consent, the colonoscope                            was passed under direct vision. Throughout the                            procedure, the patient's blood pressure, pulse, and                            oxygen saturations were monitored continuously. The                            CF [id number] #[id number] was introduced through the anus                            and advanced to the  the cecum, identified by                            appendiceal orifice and ileocecal valve. The                            colonoscopy was performed without difficulty. The                            patient tolerated the procedure well. The quality                            of the bowel preparation was good. The ileocecal                            valve, appendiceal orifice, and rectum were                            photographed. The bowel preparation used was SUPREP                            via split dose instruction. Scope In: 8:04:57 AM Scope Out: 8:34:44 AM Scope Withdrawal Time: 0 hours 24 minutes 20 seconds  Total Procedure Duration: 0 hours 29 minutes 47 seconds  Findings:                 The perianal and digital rectal examinations were                            normal.                           Four flat and sessile polyps were found in the                            rectum, descending colon, ascending colon and                            cecum. The polyps were 4 to 20 mm in size. These  polyps were removed with a cold snare. Resection                            and retrieval were complete. Verification of                            patient identification for the specimen was done.                            Estimated blood loss was minimal. To stop active                            bleeding, one hemostatic clip was successfully                            placed. There was no bleeding at the end of the                            procedure.                           The exam was otherwise without abnormality on                            direct and retroflexion views. Complications:            No immediate complications. Estimated Blood Loss:     Estimated blood loss was minimal. Impression:               - Four 4 to 20 mm polyps in the rectum (5mm                            sessile), in the descending colon (6 mm sessile),                             in the ascending colon (20 mm flat - piecemeal                            removal) and in the cecum (3 mm), removed with a                            cold snare. Resected and retrieved. Clip was placed                            to stop bleeding in rectum.                           - The examination was otherwise normal on direct                            and retroflexion views. Recommendation:           - Patient has a contact number available for  emergencies. The signs and symptoms of potential                            delayed complications were discussed with the                            patient. Return to normal activities tomorrow.                            Written discharge instructions were provided to the                            patient.                           - Resume previous diet.                           - Continue present medications.                           - Repeat colonoscopy is recommended. The                            colonoscopy date will be determined after pathology                            results from today's exam become available for                            review. Iva Boop, MD 09/11/2022 8:45:19 AM This report has been signed electronically.

## 2022-09-11 NOTE — Patient Instructions (Addendum)
Please read handouts provided. Continue present medications. Clip Card.   YOU HAD AN ENDOSCOPIC PROCEDURE TODAY AT THE [facility]:   Refer to the procedure report that was given to you for any specific questions about what was found during the examination.  If the procedure report does not answer your questions, please call your gastroenterologist to clarify.  If you requested that your care partner not be given the details of your procedure findings, then the procedure report has been included in a sealed envelope for you to review at your convenience later.  YOU SHOULD EXPECT: Some feelings of bloating in the abdomen. Passage of more gas than usual.  Walking can help get rid of the air that was put into your GI tract during the procedure and reduce the bloating. If you had a lower endoscopy (such as a colonoscopy or flexible sigmoidoscopy) you may notice spotting of blood in your stool or on the toilet paper. If you underwent a bowel prep for your procedure, you may not have a normal bowel movement for a few days.  Please Note:  You might notice some irritation and congestion in your nose or some drainage.  This is from the oxygen used during your procedure.  There is no need for concern and it should clear up in a day or so.  SYMPTOMS TO REPORT IMMEDIATELY:  Following lower endoscopy (colonoscopy or flexible sigmoidoscopy):  Excessive amounts of blood in the stool  Significant tenderness or worsening of abdominal pains  Swelling of the abdomen that is new, acute  Fever of 100F or higher   For urgent or emergent issues, a gastroenterologist can be reached at any hour by calling [phone]. Do not use MyChart messaging for urgent concerns.    DIET:  We do recommend a small meal at first, but then you may proceed to your regular diet.  Drink plenty of fluids but you should avoid alcoholic beverages for 24 hours.  ACTIVITY:  You should plan to take it easy for the rest of  today and you should NOT DRIVE or use heavy machinery until tomorrow (because of the sedation medicines used during the test).    FOLLOW UP: Our staff will call the number listed on your records the next business day following your procedure.  We will call around 7:15- 8:00 am to check on you and address any questions or concerns that you may have regarding the information given to you following your procedure. If we do not reach you, we will leave a message.     If any biopsies were taken you will be contacted by phone or by letter within the next 1-3 weeks.  Please call us at [phone] if you have not heard about the biopsies in 3 weeks.    SIGNATURES/CONFIDENTIALITY: You and/or your care partner have signed paperwork which will be entered into your electronic medical record.  These signatures attest to the fact that that the information above on your After Visit Summary has been reviewed and is understood.  Full responsibility of the confidentiality of this discharge information lies with you and/or your care-partner.I found and removed 4 polyps - all look benign.  I will let you know pathology results and when to have another routine colonoscopy by mail and/or My Chart.  I appreciate the opportunity to care for you. Iva Boop, MD, [facility]

## 2022-09-12 ENCOUNTER — Telehealth: Payer: Self-pay

## 2022-09-12 NOTE — Telephone Encounter (Signed)
  Follow up Call-     09/11/2022    7:07 AM  Call back number  Post procedure Call Back phone  # [phone]  Permission to leave phone message Yes     Patient questions:  Do you have a fever, pain , or abdominal swelling? No. Pain Score  0 *  Have you tolerated food without any problems? Yes.    Have you been able to return to your normal activities? Yes.    Do you have any questions about your discharge instructions: Diet   No. Medications  No. Follow up visit  No.  Do you have questions or concerns about your Care? No.  Actions: * If pain score is 4 or above: No action needed, pain <4.

## 2022-09-22 ENCOUNTER — Encounter: Payer: Self-pay | Admitting: Internal Medicine

## 2022-09-25 ENCOUNTER — Telehealth: Payer: Self-pay | Admitting: Internal Medicine

## 2022-09-25 NOTE — Telephone Encounter (Signed)
Pt was made aware of Recent results and Dr. Leone Payor recommendations.  6 month recall placed in Epic. Pt made aware.  Pt verbalized understanding with all questions answered.

## 2022-09-25 NOTE — Telephone Encounter (Signed)
Inbound call from patient needing lab results.   Please advise.

## 2022-09-26 ENCOUNTER — Other Ambulatory Visit: Payer: Self-pay | Admitting: Hematology and Oncology

## 2022-09-27 ENCOUNTER — Encounter: Payer: Self-pay | Admitting: Internal Medicine

## 2022-10-24 ENCOUNTER — Telehealth: Payer: Self-pay | Admitting: Internal Medicine

## 2022-10-24 NOTE — Telephone Encounter (Signed)
Inbound call from patient. States she is self pay. She has questions about the billing of her colonoscopy from 5/6. Requesting a call back. Please advise,thank you.

## 2022-10-27 NOTE — Telephone Encounter (Signed)
Inbound call from patient requesting a call back regarding previous not. Please advise, thank you.

## 2022-12-27 ENCOUNTER — Encounter: Payer: Self-pay | Admitting: Internal Medicine

## 2023-01-31 ENCOUNTER — Telehealth: Payer: Self-pay | Admitting: Physical Therapy

## 2023-01-31 ENCOUNTER — Encounter: Payer: Self-pay | Admitting: Physical Therapy

## 2023-01-31 NOTE — Telephone Encounter (Signed)
Returned pt's call regarding compression garments. She had questions about how to get a new compression sleeve and a new nighttime garment. Encouraged her to: Contact [facility] to discuss various options that may help with her c/o thumb numbness in the morning.  Get the same compression daytime sleeve she has and replace it every 4 months since it seems to be working well. Contact me if she needs new prescriptions for her daytime or night time garment Contact us if swelling worsens to resume PT Pt. Verbalized good understanding. I will send the [facility] contact information to her when I get it. Bethann Punches, [place] 01/31/23 12:16 PM

## 2023-03-09 ENCOUNTER — Ambulatory Visit (AMBULATORY_SURGERY_CENTER): Payer: Self-pay

## 2023-03-09 VITALS — Ht 64.0 in | Wt 135.0 lb

## 2023-03-09 DIAGNOSIS — Z860100 Personal history of colon polyps, unspecified: Secondary | ICD-10-CM

## 2023-03-09 MED ORDER — NA SULFATE-K SULFATE-MG SULF 17.5-3.13-1.6 GM/177ML PO SOLN: 1.0000 | Freq: Once | ORAL | 0 refills | Status: AC

## 2023-03-09 NOTE — Progress Notes (Signed)
Pre visit completed via phone call; Patient verified name, DOB, and address; No egg or soy allergy known to patient;  No issues known to pt with past sedation with any surgeries or procedures; Patient denies ever being told they had issues or difficulty with intubation;  No FH of Malignant Hyperthermia; Pt is not on diet pills; Pt is not on home 02;  Pt is not on blood thinners;  Pt denies issues with constipation;  No A fib or A flutter; Have any cardiac testing pending--NO Insurance verified during PV appt--- self pay Pt can ambulate without assistance;  Pt denies use of chewing tobacco; Discussed diabetic/weight loss medication holds; Discussed NSAID holds; Checked BMI to be less than 50; Pt instructed to use [employer] or GoodRx for a price reduction on prep;  Patient's chart reviewed by Cathlyn Parsons CNRA prior to previsit and patient appropriate for the [facility];  Pre visit completed and red dot placed by patient's name on their procedure day (on provider's schedule);  Instructions sent to MyChart per patient request;
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
## 2023-03-27 ENCOUNTER — Encounter: Payer: Self-pay | Admitting: Internal Medicine

## 2023-03-27 ENCOUNTER — Ambulatory Visit (AMBULATORY_SURGERY_CENTER): Payer: Self-pay | Admitting: Internal Medicine

## 2023-03-27 VITALS — BP 139/65 | HR 61 | Temp 97.5°F | Resp 12 | Ht 64.0 in | Wt 135.0 lb

## 2023-03-27 DIAGNOSIS — D122 Benign neoplasm of ascending colon: Secondary | ICD-10-CM

## 2023-03-27 DIAGNOSIS — K5732 Diverticulitis of large intestine without perforation or abscess without bleeding: Secondary | ICD-10-CM

## 2023-03-27 DIAGNOSIS — Z1211 Encounter for screening for malignant neoplasm of colon: Secondary | ICD-10-CM

## 2023-03-27 DIAGNOSIS — Z860100 Personal history of colon polyps, unspecified: Secondary | ICD-10-CM

## 2023-03-27 MED ORDER — SODIUM CHLORIDE 0.9 % IV SOLN: 500.0000 mL | INTRAVENOUS | Status: AC

## 2023-03-27 NOTE — Op Note (Addendum)
[facility] Patient Name: Sierra Newman Procedure Date: 03/27/2023 8:33 AM MRN: [id number] Endoscopist: Iva Boop , MD, [id number] Age: 52 Referring MD:  Date of Birth: 09/04/1970 Gender: Female Account #: [id number] Procedure:                Colonoscopy Indications:              Surveillance: Piecemeal removal of large adenoma                            last colonoscopy (< 3 yrs) 20 mm flat adenoma                            removed 09/2022 Medicines:                Monitored Anesthesia Care Procedure:                Pre-Anesthesia Assessment:                           - Prior to the procedure, a History and Physical                            was performed, and patient medications and                            allergies were reviewed. The patient's tolerance of                            previous anesthesia was also reviewed. The risks                            and benefits of the procedure and the sedation                            options and risks were discussed with the patient.                            All questions were answered, and informed consent                            was obtained. Prior Anticoagulants: The patient has                            taken no anticoagulant or antiplatelet agents. ASA                            Grade Assessment: II - A patient with mild systemic                            disease. After reviewing the risks and benefits,                            the patient was deemed in satisfactory condition to  undergo the procedure.                           After obtaining informed consent, the colonoscope                            was passed under direct vision. Throughout the                            procedure, the patient's blood pressure, pulse, and                            oxygen saturations were monitored continuously. The                            [id number]-HQ190L Colonoscope [id number] was  introduced                            through the anus and advanced to the the cecum,                            identified by appendiceal orifice and ileocecal                            valve. The colonoscopy was performed without                            difficulty. The patient tolerated the procedure                            well. The quality of the bowel preparation was                            good. The ileocecal valve, appendiceal orifice, and                            rectum were photographed. The bowel preparation                            used was SUPREP via split dose instruction. Scope In: 8:47:25 AM Scope Out: 9:12:50 AM Scope Withdrawal Time: 0 hours 20 minutes 31 seconds  Total Procedure Duration: 0 hours 25 minutes 25 seconds  Findings:                 The perianal and digital rectal examinations were                            normal.                           A medium post polypectomy scar was found in the                            ascending colon. There was residual polyp tissue.  A 5 mm polyp was found in the ascending colon in                            the scar described above The polyp was sessile. The                            polyp was removed with a cold snare + forceps.                            Resection and retrieval were complete. Verification                            of patient identification for the specimen was                            done. Estimated blood loss was minimal. Area was                            tattooed with an injection of 1 mL of Spot (carbon                            black). Also used soft tip coag to treat the area                            for ablation.                           A few diverticula were found in the sigmoid colon.                           The exam was otherwise without abnormality on                            direct and retroflexion views. Complications:            No  immediate complications. Estimated Blood Loss:     Estimated blood loss was minimal. Impression:               - Post-polypectomy scar in the ascending colon.                           - One 5 mm polyp in the ascending colon scar,                            removed with a cold snare + forceps. Resected and                            retrieved. Area also ablated with soft tip coag and                            tattooed.                           -  Diverticulosis in the sigmoid colon.                           - The examination was otherwise normal on direct                            and retroflexion views. Recommendation:           - Patient has a contact number available for                            emergencies. The signs and symptoms of potential                            delayed complications were discussed with the                            patient. Return to normal activities tomorrow.                            Written discharge instructions were provided to the                            patient.                           - Resume previous diet.                           - Continue present medications.                           - Await pathology results.                           - Repeat colonoscopy is recommended for                            surveillance. The colonoscopy date will be                            determined after pathology results from today's                            exam become available for review. Iva Boop, MD 03/27/2023 9:25:20 AM This report has been signed electronically.

## 2023-03-27 NOTE — Patient Instructions (Addendum)
It looked like there was a tiny residual polyp - removed and treated as described in the report.  No other polyps seen.  I did see a few diverticula this time.  Once I see pathology will let you know when to repeat an exam.  I appreciate the opportunity to care for you. Iva Boop, MD, [facility]  Resume all of your previous medications as ordered.  Read all of your discharge instructions.  YOU HAD AN ENDOSCOPIC PROCEDURE TODAY AT THE [facility]:   Refer to the procedure report that was given to you for any specific questions about what was found during the examination.  If the procedure report does not answer your questions, please call your gastroenterologist to clarify.  If you requested that your care partner not be given the details of your procedure findings, then the procedure report has been included in a sealed envelope for you to review at your convenience later.  YOU SHOULD EXPECT: Some feelings of bloating in the abdomen. Passage of more gas than usual.  Walking can help get rid of the air that was put into your GI tract during the procedure and reduce the bloating. If you had a lower endoscopy (such as a colonoscopy or flexible sigmoidoscopy) you may notice spotting of blood in your stool or on the toilet paper. If you underwent a bowel prep for your procedure, you may not have a normal bowel movement for a few days.  Please Note:  You might notice some irritation and congestion in your nose or some drainage.  This is from the oxygen used during your procedure.  There is no need for concern and it should clear up in a day or so.  SYMPTOMS TO REPORT IMMEDIATELY:  Following lower endoscopy (colonoscopy or flexible sigmoidoscopy):  Excessive amounts of blood in the stool  Significant tenderness or worsening of abdominal pains  Swelling of the abdomen that is new, acute  Fever of 100F or higher   For urgent or emergent issues, a gastroenterologist can be reached at  any hour by calling [phone]. Do not use MyChart messaging for urgent concerns.    DIET:  We do recommend a small meal at first, but then you may proceed to your regular diet.  Drink plenty of fluids but you should avoid alcoholic beverages for 24 hours. Try to eat a high fiber diet, and d rink plenty of water.  ACTIVITY:  You should plan to take it easy for the rest of today and you should NOT DRIVE or use heavy machinery until tomorrow (because of the sedation medicines used during the test).    FOLLOW UP: Our staff will call the number listed on your records the next business day following your procedure.  We will call around 7:15- 8:00 am to check on you and address any questions or concerns that you may have regarding the information given to you following your procedure. If we do not reach you, we will leave a message.     If any biopsies were taken you will be contacted by phone or by letter within the next 1-3 weeks.  Please call us at [phone] if you have not heard about the biopsies in 3 weeks.    SIGNATURES/CONFIDENTIALITY: You and/or your care partner have signed paperwork which will be entered into your electronic medical record.  These signatures attest to the fact that that the information above on your After Visit Summary has been reviewed and is understood.  Full responsibility of the confidentiality of this discharge information lies with you and/or your care-partner.

## 2023-03-27 NOTE — Progress Notes (Signed)
Report to PACU, RN, vss, BBS= Clear.  

## 2023-03-27 NOTE — Progress Notes (Signed)
Pt's states no medical or surgical changes since previsit or office visit. 

## 2023-03-27 NOTE — Progress Notes (Signed)
Called to room to assist during endoscopic procedure.  Patient ID and intended procedure confirmed with present staff. Received instructions for my participation in the procedure from the performing physician.  

## 2023-03-28 ENCOUNTER — Telehealth: Payer: Self-pay

## 2023-03-28 NOTE — Telephone Encounter (Signed)
Post procedure follow up call: no answer

## 2023-03-29 LAB — SURGICAL PATHOLOGY

## 2023-04-02 ENCOUNTER — Encounter: Payer: Self-pay | Admitting: Internal Medicine

## 2023-06-05 ENCOUNTER — Ambulatory Visit (HOSPITAL_COMMUNITY): Payer: Self-pay

## 2023-06-06 ENCOUNTER — Ambulatory Visit (HOSPITAL_COMMUNITY)
Admission: RE | Admit: 2023-06-06 | Discharge: 2023-06-06 | Disposition: A | Discharge status: Home / Self Care | Payer: Self-pay | Source: Ambulatory Visit | Attending: Hematology and Oncology | Admitting: Hematology and Oncology

## 2023-06-06 ENCOUNTER — Encounter (HOSPITAL_COMMUNITY): Payer: Self-pay

## 2023-06-06 DIAGNOSIS — C50412 Malignant neoplasm of upper-outer quadrant of left female breast: Secondary | ICD-10-CM | POA: Insufficient documentation

## 2023-06-06 DIAGNOSIS — Z170 Estrogen receptor positive status [ER+]: Secondary | ICD-10-CM | POA: Insufficient documentation

## 2023-06-06 LAB — POCT I-STAT CREATININE: Creatinine, Ser: 0.9 mg/dL (ref 0.44–1.00)

## 2023-06-06 MED ORDER — IOHEXOL 300 MG/ML  SOLN
75.0000 mL | Freq: Once | INTRAMUSCULAR | Status: AC | PRN
  Administered 2023-06-06: 75 mL via INTRAVENOUS

## 2023-06-11 ENCOUNTER — Other Ambulatory Visit: Payer: Self-pay | Admitting: Hematology and Oncology

## 2023-06-18 ENCOUNTER — Inpatient Hospital Stay: Payer: Self-pay | Attending: Hematology and Oncology | Admitting: Hematology and Oncology

## 2023-06-18 VITALS — BP 139/67 | HR 80 | Temp 97.7°F | Resp 17 | Ht 64.0 in | Wt 136.6 lb

## 2023-06-18 DIAGNOSIS — C50412 Malignant neoplasm of upper-outer quadrant of left female breast: Secondary | ICD-10-CM | POA: Insufficient documentation

## 2023-06-18 DIAGNOSIS — I890 Lymphedema, not elsewhere classified: Secondary | ICD-10-CM | POA: Insufficient documentation

## 2023-06-18 DIAGNOSIS — Z9013 Acquired absence of bilateral breasts and nipples: Secondary | ICD-10-CM | POA: Insufficient documentation

## 2023-06-18 DIAGNOSIS — Z170 Estrogen receptor positive status [ER+]: Secondary | ICD-10-CM | POA: Insufficient documentation

## 2023-06-18 DIAGNOSIS — Z79811 Long term (current) use of aromatase inhibitors: Secondary | ICD-10-CM | POA: Insufficient documentation

## 2023-06-18 DIAGNOSIS — Z1721 Progesterone receptor positive status: Secondary | ICD-10-CM | POA: Insufficient documentation

## 2023-06-18 DIAGNOSIS — Z79899 Other long term (current) drug therapy: Secondary | ICD-10-CM | POA: Insufficient documentation

## 2023-06-18 DIAGNOSIS — R5383 Other fatigue: Secondary | ICD-10-CM | POA: Insufficient documentation

## 2023-06-18 NOTE — Progress Notes (Signed)
 Patient Care Team: Bufford Carne, FNP as PCP - General (Family Medicine) Cameron Cea, MD as Consulting Physician (Hematology and Oncology) Johna Myers, MD as Consulting Physician (Radiation Oncology) Enid Harry, MD as Consulting Physician (General Surgery) Kenney Peacemaker, MD as Consulting Physician (Gastroenterology) Cristobal Donning, RN as Charge Nurse  DIAGNOSIS:  Encounter Diagnosis  Name Primary?   Malignant neoplasm of upper-outer quadrant of left breast in female, estrogen receptor positive (HCC) Yes    SUMMARY OF ONCOLOGIC HISTORY: Oncology History  Malignant neoplasm of upper-outer quadrant of left breast in female, estrogen receptor positive (HCC)  04/14/2021 Initial Diagnosis   Palpable lump in the left breast, ultrasound and biopsy revealed grade 1-2 IDC with DCIS ER 80%, PR 80%, HER2 2+ by IHC FISH negative, Ki-67 15%   04/25/2021 Breast MRI   3.0 x 2.0 x 1.8 cm enhancing mass in the upper left breast consistent with the patient's biopsy-proven malignancy, and suspicious 1.1 cm mass in the far posterior lower outer quadrant of the right breast abutting the chest wall.     Genetic Testing   Negative genetic testing. No pathogenic variants identified on the [facility] CancerNext-Expanded+RNA panel. The report date is 05/13/2021.  The CancerNext-Expanded + RNAinsight gene panel offered by [employer] and includes sequencing and rearrangement analysis for the following 77 genes: IP, ALK, APC*, ATM*, AXIN2, BAP1, BARD1, BLM, BMPR1A, BRCA1*, BRCA2*, BRIP1*, CDC73, CDH1*,CDK4, CDKN1B, CDKN2A, CHEK2*, CTNNA1, DICER1, FANCC, FH, FLCN, GALNT12, KIF1B, LZTR1, MAX, MEN1, MET, MLH1*, MSH2*, MSH3, MSH6*, MUTYH*, NBN, NF1*, NF2, NTHL1, PALB2*, PHOX2B, PMS2*, POT1, PRKAR1A, PTCH1, PTEN*, RAD51C*, RAD51D*,RB1, RECQL, RET, SDHA, SDHAF2, SDHB, SDHC, SDHD, SMAD4, SMARCA4, SMARCB1, SMARCE1, STK11, SUFU, TMEM127, TP53*,TSC1, TSC2, VHL and XRCC2 (sequencing and deletion/duplication); EGFR,  EGLN1, HOXB13, KIT, MITF, PDGFRA, POLD1 and POLE (sequencing only); EPCAM and GREM1 (deletion/duplication only).   06/07/2021 Cancer Staging   Staging form: Breast, AJCC 8th Edition - Pathologic stage from 06/07/2021: Stage IA (pT1c, pN1a, cM0, G1, ER+, PR+, HER2-) - Signed by Percival Brace, NP on 06/09/2021 Stage prefix: Initial diagnosis Histologic grading system: 3 grade system   06/07/2021 Surgery   Bilateral mastectomies: Left mastectomy: Grade 1 IDC with DCIS, 1.5 cm, margins negative, 1/4 lymph node positive ER 90%, PR 80%, HER2 equivocal by IHC FISH negative ratio 1.26, Ki-67 5% Right mastectomy: Grade 1 IDC with DCIS 0.5 cm, 0/3 lymph nodes negative ER 100%, PR 60%, HER2 equivocal by IHC, FISH negative ratio 1.19, Ki-67 1%   07/18/2021 - 09/01/2021 Radiation Therapy   Site Technique Total Dose (Gy) Dose per Fx (Gy) Completed Fx Beam Energies  Chest Wall, Left: CW_L 3D 50.4/50.4 1.8 28/28 6X  Chest Wall, Left: CW_L_SCLV 3D 50.4/50.4 1.8 28/28 6X, 10X  Chest Wall, Left: CW_L_Bst Electron 10/10 2 5/5 6E     08/29/2021 -  Anti-estrogen oral therapy   letrozole  2.5 mg daily x 5-7 years     CHIEF COMPLIANT: Follow-up on letrozole  therapy  HISTORY OF PRESENT ILLNESS:  History of Present Illness   Sierra Newman is a 53 year old female with breast cancer who presents for follow-up after surgery and ongoing treatment with letrozole .  She is two years post-surgery for breast cancer and is currently on letrozole . She experiences fatigue, which she attributes to the medication. Recent scans have shown improvement and are considered benign.  She manages medication costs using [employer], obtaining a 90-day supply of letrozole  covered by a Physiological scientist. She continues to manage lymphedema with compression therapy and reports  some soreness. She experiences tightness and soreness, particularly on the side where radiation was administered, and engages in yoga and stretching  exercises to maintain range of motion. She notes the scar tissue is thicker in that area.  She has not undergone the [employer] blood test due to cost concerns, as she is self-pay. She is considering a similar test offered by [employer],       ALLERGIES:  has no known allergies.  MEDICATIONS:  Current Outpatient Medications  Medication Sig Dispense Refill   acetaminophen  (TYLENOL ) 500 MG tablet Take 1,000 mg by mouth every 6 (six) hours as needed for mild pain.     ALPRAZolam  (XANAX ) 0.25 MG tablet Take 0.25 mg by mouth at bedtime as needed for anxiety.     fluticasone  (FLONASE ) 50 MCG/ACT nasal spray Place 1 spray into both nostrils daily.     gabapentin  (NEURONTIN ) 100 MG capsule Take 100 mg by mouth at bedtime.     Ibuprofen 200 MG CAPS Take 200 mg by mouth daily as needed (pain).     L-Lysine 1000 MG TABS Take 1 tablet by mouth daily as needed.     lamoTRIgine  (LAMICTAL ) 150 MG tablet Take 150 mg by mouth daily.     letrozole  (FEMARA ) 2.5 MG tablet TAKE 1 TABLET BY MOUTH DAILY 90 tablet 3   losartan (COZAAR) 50 MG tablet Take 50 mg by mouth daily.     methocarbamol  (ROBAXIN ) 500 MG tablet Take 500 mg by mouth 3 (three) times daily as needed.     prazosin  (MINIPRESS ) 1 MG capsule Take 1 mg by mouth at bedtime.     Red Yeast Rice 600 MG CAPS Take 1 capsule by mouth daily at 6 (six) AM.     sertraline  (ZOLOFT ) 50 MG tablet Take 50 mg by mouth daily.     simvastatin (ZOCOR) 20 MG tablet Take 20 mg by mouth daily at 6 PM.     No current facility-administered medications for this visit.    PHYSICAL EXAMINATION: ECOG PERFORMANCE STATUS: 1 - Symptomatic but completely ambulatory  Vitals:   06/18/23 1043  BP: 139/67  Pulse: 80  Resp: 17  Temp: 97.7 F (36.5 C)  SpO2: 100%   Filed Weights   06/18/23 1043  Weight: 136 lb 9.6 oz (62 kg)     LABORATORY DATA:  I have reviewed the data as listed    Latest Ref Rng & Units 06/06/2023    5:24 PM  CMP  Creatinine 0.44 - 1.00 mg/dL  6.21     No results found for: "WBC", "HGB", "HCT", "MCV", "PLT", "NEUTROABS"  ASSESSMENT & PLAN:  Malignant neoplasm of upper-outer quadrant of left breast in female, estrogen receptor positive (HCC) 06/07/2021:Bilateral mastectomies: Left mastectomy: Grade 1 IDC with DCIS, 1.5 cm, margins negative, 1/4 lymph node positive ER 90%, PR 80%, HER2 equivocal by IHC FISH negative ratio 1.26, Ki-67 5% Right mastectomy: Grade 1 IDC with DCIS 0.5 cm, 0/3 lymph nodes negative ER 100%, PR 60%, HER2 equivocal by IHC, FISH negative ratio 1.19, Ki-67 1% MammaPrint: Low risk   Treatment plan: 1. Adjuvant radiation therapy completing 09/01/2021 2. Adjuvant antiestrogen therapy:  (FSH 118, estradiol  less than 2.5: Patient is menopausal) therefore we will start her on letrozole  2.5 mg daily x5-7 years started 08/29/2021 ----------------------------------------------------------------------- CT chest follow-up of lung nodule: Lung nodules are stable, left upper lobe opacity 2.1 x 0.8 cm, soft tissue upper retroperitoneum left para-aortic region of unclear etiology   PET/CT 06/12/2022: No hypermetabolic lymph  nodes.  No suspicious lung nodules.  The left lateral chest wall abnormality is felt to be postradiation changes without any hypermetabolic activity.  Left lower lobe pulmonary nodule: Stable not associated with activity.  Abdomen: Benign  CT chest 06/15/2023: No evidence of metastatic disease stable radiation fibrosis previous described left lung nodules are stable or resolved considered benign   Radiology review: I discussed the radiology findings and did not recommend any further interventions.   Return to clinic in 1 year for follow-up.  We will obtain a CT chest annually for follow-up of lung nodules. -   No orders of the defined types were placed in this encounter.  The patient has a good understanding of the overall plan. she agrees with it. she will call with any problems that may develop before the  next visit here. Total time spent: 30 mins including face to face time and time spent for planning, charting and co-ordination of care   Viinay K Lorre Opdahl, MD 06/18/23

## 2023-06-18 NOTE — Assessment & Plan Note (Signed)
 06/07/2021:Bilateral mastectomies: Left mastectomy: Grade 1 IDC with DCIS, 1.5 cm, margins negative, 1/4 lymph node positive ER 90%, PR 80%, HER2 equivocal by IHC FISH negative ratio 1.26, Ki-67 5% Right mastectomy: Grade 1 IDC with DCIS 0.5 cm, 0/3 lymph nodes negative ER 100%, PR 60%, HER2 equivocal by IHC, FISH negative ratio 1.19, Ki-67 1% MammaPrint: Low risk   Treatment plan: 1. Adjuvant radiation therapy completing 09/01/2021 2. Adjuvant antiestrogen therapy:  (FSH 118, estradiol  less than 2.5: Patient is menopausal) therefore we will start her on letrozole  2.5 mg daily x5-7 years started 08/29/2021 ----------------------------------------------------------------------- CT chest follow-up of lung nodule: Lung nodules are stable, left upper lobe opacity 2.1 x 0.8 cm, soft tissue upper retroperitoneum left para-aortic region of unclear etiology   PET/CT 06/12/2022: No hypermetabolic lymph nodes.  No suspicious lung nodules.  The left lateral chest wall abnormality is felt to be postradiation changes without any hypermetabolic activity.  Left lower lobe pulmonary nodule: Stable not associated with activity.  Abdomen: Benign  CT chest 06/15/2023: No evidence of metastatic disease stable radiation fibrosis previous described left lung nodules are stable or resolved considered benign   Radiology review: I discussed the radiology findings and did not recommend any further interventions.   Return to clinic in 1 year for follow-up.  We will obtain a CT chest annually for follow-up of lung nodules.

## 2023-06-20 ENCOUNTER — Telehealth: Payer: Self-pay | Admitting: Hematology and Oncology

## 2023-06-20 NOTE — Telephone Encounter (Signed)
Scheduled appointment per 2/10 los. Patient is aware of the made appointment.

## 2023-12-28 NOTE — Progress Notes (Signed)
 PROVIDER:  DONNICE CARLIN BURY, MD  MRN: [id number] DOB: [birth year] DATE OF ENCOUNTER: 12/28/2023 Subjective     Chief Complaint: Follow-up ( 1 YR LTFU/)     History of Present Illness: 53 year old female who was found on screening mammogram to have a 3.4 cm architectural distortion in the left breast.  This was a 2.5 cm mass with normal nodes.  Biopsy showed a grade 1-2 invasive ductal carcinoma that was ER/PR positive, HER2 negative.  She underwent a MRI with a normal mammogram on the right side that showed a 1.1 cm mass.  There was no adenopathy.  This was also a grade 1-2 invasive ductal carcinoma that was ER/PR positive.  She then proceeded to undergo a right nipple sparing mastectomy, right axillary sentinel lymph node biopsy, left nipple sparing mastectomy, and a left axillary sentinel lymph node biopsy 1/23.   Her pathology on the left shows a 1.5 cm invasive in situ ductal carcinoma with negative margins. The left side has 1 out of 3 lymph nodes that are positive.  This is an 80% ER positive, 80% PR positive, negative HER2/neu and a Ki-67 of 5%.  On the right side there is a 5 mm invasive in situ ductal carcinoma with 3 negative sentinel lymph nodes.  This is 100% ER positive, 60% PR positive, HER2 is negative, and Ki-67 is 1%. she underwent radiotherapy to the left side.  She is now on letrozole  and doing ok. Has a sleeve she wears for some lymphedema to the left arm but is overall doing very well   Review of Systems: A complete review of systems was obtained from the patient.  I have reviewed this information and discussed as appropriate with the patient.  See HPI as well for other ROS.  Review of Systems  All other systems reviewed and are negative.     Medical History: Past Medical History:  Diagnosis Date  . Anxiety   . History of cancer     Patient Active Problem List  Diagnosis  . Malignant neoplasm of lower-outer quadrant of right breast of female, estrogen  receptor positive (CMS/HHS-HCC)    Past Surgical History:  Procedure Laterality Date  . MASTECTOMY SIMPLE Bilateral 06/07/2021   NSM  . BREAST EXCISIONAL BIOPSY Right    radial scar  . CATARACT EXTRACTION    . CRANIOTOMY EXPLORATORY    . DEEP AXILLARY SENTINEL NODE BIOPSY / EXCISION Bilateral      Allergies  Allergen Reactions  . Bupropion Hcl Other (See Comments)  . Escitalopram Nausea    Current Outpatient Medications on File Prior to Visit  Medication Sig Dispense Refill  . ALPRAZolam  (XANAX ) 0.25 MG tablet TAKE 1 TABLET BY MOUTH EVERY 6 TO 8 HOURS AS NEEDED FOR ANXIETY ATTACKS    . amoxicillin-clavulanate (AUGMENTIN) 875-125 mg tablet Take 1 tablet by mouth every 12 (twelve) hours    . fluticasone  propionate (FLONASE ) 50 mcg/actuation nasal spray Place 2 sprays into both nostrils once daily    . gabapentin  (NEURONTIN ) 100 MG capsule TAKE 1 CAPSULE BY MOUTH TWICE DAILY AS NEEDED FOR ANXIETY OR INSOMNIA    . L-LYSINE ORAL Take by mouth    . lamoTRIgine  (LAMICTAL ) 150 MG tablet lamotrigine  150 mg tablet    . letrozole  (FEMARA ) 2.5 mg tablet Take 1 tablet by mouth once daily    . losartan (COZAAR) 50 MG tablet Take 50 mg by mouth once daily    . melatonin 5 mg Tab Take by mouth    .  prazosin  (MINIPRESS ) 1 MG capsule prazosin  1 mg capsule    . sertraline  (ZOLOFT ) 50 MG tablet Take 1 tablet (50 mg total) by mouth every morning    . simvastatin (ZOCOR) 20 MG tablet TAKE 1 TABLET BY MOUTH IN THE EVENING ONCE DAILY     No current facility-administered medications on file prior to visit.    Family History  Problem Relation Age of Onset  . Skin cancer Father   . High blood pressure (Hypertension) Father   . Diabetes Father      Social History   Tobacco Use  Smoking Status Never  Smokeless Tobacco Never     Social History   Socioeconomic History  . Marital status: Married  Tobacco Use  . Smoking status: Never  . Smokeless tobacco: Never  Vaping Use  . Vaping  status: Never Used  Substance and Sexual Activity  . Alcohol use: Yes    Comment: 1 per month  . Drug use: Never   Social Drivers of Health   Housing Stability: Unknown (12/28/2023)   Housing Stability Vital Sign   . Homeless in the Last Year: No    Objective:    Vitals:   12/28/23 1449  PainSc: 0-No pain    There is no height or weight on file to calculate BMI.  Physical Exam Vitals reviewed.  Constitutional:      Appearance: Normal appearance.  Chest:  Breasts:    Right: No mass.     Left: No mass.  Lymphadenopathy:     Upper Body:     Right upper body: No supraclavicular or axillary adenopathy.     Left upper body: No supraclavicular or axillary adenopathy.  Neurological:     Mental Status: She is alert.         Assessment and Plan:     She has no clinical or radiologic evidence of any disease.  She is overall well.  She does have continued lymphedema in her left upper extremity she is wearing a sleeve for her.  I think it be reasonable to have her go see PT/lymphedema therapy again just to see if there is anything else we can do for her.  Otherwise I will see her back in 1 year.   MATTHEW CARLIN BURY, MD

## 2024-01-03 ENCOUNTER — Telehealth: Payer: Self-pay | Admitting: *Deleted

## 2024-01-03 NOTE — Telephone Encounter (Signed)
 Pt called stating that she has a lump in her throat and she has been horse for the past few weeks. ABT has been given by PCP but still no improvement. Pt is concerned that it could be cancer related. Advised that she can be seen in our [facility] for evaluation. Pt agreed to be scheduled with our NP for 8/29 at 1000. Pt verbalized understanding

## 2024-01-04 ENCOUNTER — Inpatient Hospital Stay: Payer: Self-pay | Attending: Adult Health | Admitting: Adult Health

## 2024-01-04 ENCOUNTER — Ambulatory Visit (HOSPITAL_BASED_OUTPATIENT_CLINIC_OR_DEPARTMENT_OTHER)
Admission: RE | Admit: 2024-01-04 | Discharge: 2024-01-04 | Disposition: A | Discharge status: Home / Self Care | Payer: Self-pay | Source: Ambulatory Visit | Attending: Adult Health | Admitting: Adult Health

## 2024-01-04 VITALS — BP 140/81 | HR 93 | Temp 98.0°F | Resp 16 | Ht 63.0 in | Wt 130.3 lb

## 2024-01-04 DIAGNOSIS — Z170 Estrogen receptor positive status [ER+]: Secondary | ICD-10-CM | POA: Insufficient documentation

## 2024-01-04 DIAGNOSIS — Z9882 Breast implant status: Secondary | ICD-10-CM | POA: Insufficient documentation

## 2024-01-04 DIAGNOSIS — Z79811 Long term (current) use of aromatase inhibitors: Secondary | ICD-10-CM | POA: Insufficient documentation

## 2024-01-04 DIAGNOSIS — R911 Solitary pulmonary nodule: Secondary | ICD-10-CM | POA: Insufficient documentation

## 2024-01-04 DIAGNOSIS — R499 Unspecified voice and resonance disorder: Secondary | ICD-10-CM | POA: Insufficient documentation

## 2024-01-04 DIAGNOSIS — Z800 Family history of malignant neoplasm of digestive organs: Secondary | ICD-10-CM | POA: Insufficient documentation

## 2024-01-04 DIAGNOSIS — R070 Pain in throat: Secondary | ICD-10-CM | POA: Insufficient documentation

## 2024-01-04 DIAGNOSIS — Z860101 Personal history of adenomatous and serrated colon polyps: Secondary | ICD-10-CM | POA: Insufficient documentation

## 2024-01-04 DIAGNOSIS — Z9841 Cataract extraction status, right eye: Secondary | ICD-10-CM | POA: Insufficient documentation

## 2024-01-04 DIAGNOSIS — Z79899 Other long term (current) drug therapy: Secondary | ICD-10-CM | POA: Insufficient documentation

## 2024-01-04 DIAGNOSIS — Z9842 Cataract extraction status, left eye: Secondary | ICD-10-CM | POA: Insufficient documentation

## 2024-01-04 DIAGNOSIS — Z1721 Progesterone receptor positive status: Secondary | ICD-10-CM | POA: Insufficient documentation

## 2024-01-04 DIAGNOSIS — J029 Acute pharyngitis, unspecified: Secondary | ICD-10-CM | POA: Insufficient documentation

## 2024-01-04 DIAGNOSIS — Z801 Family history of malignant neoplasm of trachea, bronchus and lung: Secondary | ICD-10-CM | POA: Insufficient documentation

## 2024-01-04 DIAGNOSIS — Z9013 Acquired absence of bilateral breasts and nipples: Secondary | ICD-10-CM | POA: Insufficient documentation

## 2024-01-04 DIAGNOSIS — C50412 Malignant neoplasm of upper-outer quadrant of left female breast: Secondary | ICD-10-CM | POA: Insufficient documentation

## 2024-01-04 DIAGNOSIS — Z17411 Hormone receptor positive with human epidermal growth factor receptor 2 negative status: Secondary | ICD-10-CM | POA: Insufficient documentation

## 2024-01-04 LAB — POCT I-STAT CREATININE: Creatinine, Ser: 0.8 mg/dL (ref 0.44–1.00)

## 2024-01-04 MED ORDER — IOHEXOL 300 MG/ML  SOLN
100.0000 mL | Freq: Once | INTRAMUSCULAR | Status: AC | PRN
  Administered 2024-01-04: 100 mL via INTRAVENOUS

## 2024-01-04 NOTE — Assessment & Plan Note (Addendum)
 06/07/2021:Bilateral mastectomies: Left mastectomy: Grade 1 IDC with DCIS, 1.5 cm, margins negative, 1/4 lymph node positive ER 90%, PR 80%, HER2 equivocal by IHC FISH negative ratio 1.26, Ki-67 5% Right mastectomy: Grade 1 IDC with DCIS 0.5 cm, 0/3 lymph nodes negative ER 100%, PR 60%, HER2 equivocal by IHC, FISH negative ratio 1.19, Ki-67 1% MammaPrint: Low risk   Treatment plan: 1. Adjuvant radiation therapy completing 09/01/2021 2. Adjuvant antiestrogen therapy:  (FSH 118, estradiol  less than 2.5: Patient is menopausal) therefore we will start her on letrozole  2.5 mg daily x5-7 years started 08/29/2021 ----------------------------------------------------------------------- CT chest follow-up of lung nodule: Lung nodules are stable, left upper lobe opacity 2.1 x 0.8 cm, soft tissue upper retroperitoneum left para-aortic region of unclear etiology PET/CT 06/12/2022: No hypermetabolic lymph nodes.  No suspicious lung nodules.  The left lateral chest wall abnormality is felt to be postradiation changes without any hypermetabolic activity.  Left lower lobe pulmonary nodule: Stable not associated with activity.  Abdomen: Benign CT chest 06/15/2023: No evidence of metastatic disease stable radiation fibrosis previous described left lung nodules are stable or resolved considered benign   Current treatment: Letrozole   Throat pain: placed orders for CT neck w contrast to evaluate and r/o abscess. She will continue the Clindamycin prewscribed by her PCP office.   She will keep her f/u with ENT next week.    RTC PRN, and as scheduled for f/u with Dr. Gudena
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
## 2024-01-08 ENCOUNTER — Telehealth: Payer: Self-pay | Admitting: Adult Health

## 2024-01-08 NOTE — Telephone Encounter (Signed)
 Called and reviewed CT scan results with patient that demonstrate asymmetric enhancement at the left tongue base extending towards the vallecula measuring 11 x 9 x 9 mm possibly representing focal neoplasm.  As I spoke with the patient she was sitting in office at the ear nose and throat specialist.  They are aware of the results and are formulating an assessment and plan based on those results and the pain she is experiencing.  She will let me know if she needs any further coordination of care here at the cancer center.  Morna Kendall, NP 01/08/24 9:12 AM Medical Oncology and Hematology [facility] [address] [place], [place] [address] Tel. [phone]    Fax. [phone]
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
## 2024-01-10 ENCOUNTER — Other Ambulatory Visit: Payer: Self-pay

## 2024-01-10 ENCOUNTER — Ambulatory Visit: Payer: Self-pay | Attending: General Surgery

## 2024-01-10 DIAGNOSIS — I890 Lymphedema, not elsewhere classified: Secondary | ICD-10-CM | POA: Insufficient documentation

## 2024-01-10 DIAGNOSIS — C50511 Malignant neoplasm of lower-outer quadrant of right female breast: Secondary | ICD-10-CM | POA: Insufficient documentation

## 2024-01-10 DIAGNOSIS — Z170 Estrogen receptor positive status [ER+]: Secondary | ICD-10-CM | POA: Insufficient documentation

## 2024-01-10 DIAGNOSIS — C50512 Malignant neoplasm of lower-outer quadrant of left female breast: Secondary | ICD-10-CM | POA: Insufficient documentation

## 2024-01-16 ENCOUNTER — Encounter: Payer: Self-pay | Admitting: Adult Health

## 2024-03-06 ENCOUNTER — Ambulatory Visit: Payer: Self-pay | Admitting: Student

## 2024-05-24 ENCOUNTER — Encounter: Payer: Self-pay | Admitting: Internal Medicine

## 2024-06-03 ENCOUNTER — Ambulatory Visit: Payer: Self-pay | Admitting: Student

## 2024-06-17 ENCOUNTER — Inpatient Hospital Stay: Payer: Self-pay | Admitting: Hematology and Oncology

## 2024-08-22 ENCOUNTER — Ambulatory Visit: Payer: Self-pay | Admitting: Student
